# Patient Record
Sex: Male | Born: 1950 | Race: Black or African American | Hispanic: No | Marital: Single | Smoking: Current every day smoker
Health system: Southern US, Community
[De-identification: ages and names within clinical notes are randomized; demographics above are authoritative.]

## PROBLEM LIST (undated history)

## (undated) DIAGNOSIS — K219 Gastro-esophageal reflux disease without esophagitis: Secondary | ICD-10-CM

## (undated) DIAGNOSIS — E230 Hypopituitarism: Secondary | ICD-10-CM

## (undated) DIAGNOSIS — F17200 Nicotine dependence, unspecified, uncomplicated: Secondary | ICD-10-CM

## (undated) DIAGNOSIS — M1990 Unspecified osteoarthritis, unspecified site: Secondary | ICD-10-CM

## (undated) DIAGNOSIS — E785 Hyperlipidemia, unspecified: Secondary | ICD-10-CM

## (undated) DIAGNOSIS — I700 Atherosclerosis of aorta: Secondary | ICD-10-CM

## (undated) DIAGNOSIS — E039 Hypothyroidism, unspecified: Secondary | ICD-10-CM

## (undated) DIAGNOSIS — B182 Chronic viral hepatitis C: Secondary | ICD-10-CM

## (undated) HISTORY — PX: COLONOSCOPY: SHX174

## (undated) HISTORY — DX: Hypopituitarism: E23.0

## (undated) HISTORY — DX: Hyperlipidemia, unspecified: E78.5

## (undated) HISTORY — DX: Nicotine dependence, unspecified, uncomplicated: F17.200

## (undated) HISTORY — DX: Atherosclerosis of aorta: I70.0

---

## 1998-03-29 ENCOUNTER — Emergency Department (HOSPITAL_COMMUNITY): Admission: EM | Admit: 1998-03-29 | Discharge: 1998-03-29 | Payer: Self-pay

## 1998-04-05 ENCOUNTER — Other Ambulatory Visit: Admission: RE | Admit: 1998-04-05 | Discharge: 1998-04-05 | Payer: Self-pay | Admitting: Family Medicine

## 1998-08-25 ENCOUNTER — Emergency Department (HOSPITAL_COMMUNITY): Admission: EM | Admit: 1998-08-25 | Discharge: 1998-08-25 | Payer: Self-pay | Admitting: Emergency Medicine

## 1998-09-01 ENCOUNTER — Ambulatory Visit (HOSPITAL_COMMUNITY): Admission: RE | Admit: 1998-09-01 | Discharge: 1998-09-01 | Payer: Self-pay | Admitting: Family Medicine

## 1998-09-01 ENCOUNTER — Encounter: Payer: Self-pay | Admitting: Family Medicine

## 2000-07-04 ENCOUNTER — Encounter: Payer: Self-pay | Admitting: Emergency Medicine

## 2000-07-04 ENCOUNTER — Emergency Department (HOSPITAL_COMMUNITY): Admission: EM | Admit: 2000-07-04 | Discharge: 2000-07-04 | Payer: Self-pay | Admitting: Emergency Medicine

## 2000-12-20 ENCOUNTER — Emergency Department (HOSPITAL_COMMUNITY): Admission: EM | Admit: 2000-12-20 | Discharge: 2000-12-20 | Payer: Self-pay | Admitting: Emergency Medicine

## 2001-06-03 ENCOUNTER — Ambulatory Visit (HOSPITAL_COMMUNITY): Admission: RE | Admit: 2001-06-03 | Discharge: 2001-06-03 | Payer: Self-pay | Admitting: Family Medicine

## 2001-06-03 ENCOUNTER — Encounter: Payer: Self-pay | Admitting: Family Medicine

## 2001-07-15 ENCOUNTER — Ambulatory Visit (HOSPITAL_COMMUNITY): Admission: RE | Admit: 2001-07-15 | Discharge: 2001-07-15 | Payer: Self-pay | Admitting: Family Medicine

## 2001-07-15 ENCOUNTER — Encounter: Payer: Self-pay | Admitting: Family Medicine

## 2004-12-13 ENCOUNTER — Emergency Department (HOSPITAL_COMMUNITY): Admission: EM | Admit: 2004-12-13 | Discharge: 2004-12-13 | Payer: Self-pay | Admitting: Emergency Medicine

## 2006-02-02 ENCOUNTER — Emergency Department (HOSPITAL_COMMUNITY): Admission: EM | Admit: 2006-02-02 | Discharge: 2006-02-02 | Payer: Self-pay | Admitting: Emergency Medicine

## 2006-06-14 ENCOUNTER — Emergency Department (HOSPITAL_COMMUNITY): Admission: EM | Admit: 2006-06-14 | Discharge: 2006-06-14 | Payer: Self-pay | Admitting: Family Medicine

## 2007-04-02 ENCOUNTER — Emergency Department (HOSPITAL_COMMUNITY): Admission: EM | Admit: 2007-04-02 | Discharge: 2007-04-02 | Payer: Self-pay | Admitting: Emergency Medicine

## 2008-05-28 ENCOUNTER — Emergency Department (HOSPITAL_COMMUNITY): Admission: EM | Admit: 2008-05-28 | Discharge: 2008-05-28 | Payer: Self-pay | Admitting: Emergency Medicine

## 2008-08-04 ENCOUNTER — Ambulatory Visit: Payer: Self-pay | Admitting: Family Medicine

## 2008-09-04 ENCOUNTER — Ambulatory Visit: Payer: Self-pay | Admitting: Family Medicine

## 2008-09-04 LAB — CONVERTED CEMR LAB
ALT: 126 units/L — ABNORMAL HIGH (ref 0–53)
Albumin: 4.3 g/dL (ref 3.5–5.2)
BUN: 12 mg/dL (ref 6–23)
Basophils Absolute: 0 10*3/uL (ref 0.0–0.1)
CO2: 22 meq/L (ref 19–32)
Chlamydia, Swab/Urine, PCR: NEGATIVE
Creatinine, Ser: 0.7 mg/dL (ref 0.40–1.50)
GC Probe Amp, Urine: NEGATIVE
Glucose, Bld: 100 mg/dL — ABNORMAL HIGH (ref 70–99)
HCT: 39.1 % (ref 39.0–52.0)
LDL Cholesterol: 90 mg/dL (ref 0–99)
Lymphs Abs: 2.5 10*3/uL (ref 0.7–4.0)
MCHC: 34.5 g/dL (ref 30.0–36.0)
Monocytes Absolute: 0.5 10*3/uL (ref 0.1–1.0)
PSA: 0.01 ng/mL — ABNORMAL LOW (ref 0.10–4.00)
RBC: 4.19 M/uL — ABNORMAL LOW (ref 4.22–5.81)
RDW: 13.1 % (ref 11.5–15.5)
Sodium: 140 meq/L (ref 135–145)
Total Bilirubin: 0.7 mg/dL (ref 0.3–1.2)
Total CHOL/HDL Ratio: 4.9
WBC: 6.5 10*3/uL (ref 4.0–10.5)

## 2008-10-30 ENCOUNTER — Ambulatory Visit: Payer: Self-pay | Admitting: Family Medicine

## 2008-10-30 LAB — CONVERTED CEMR LAB
Alkaline Phosphatase: 51 units/L (ref 39–117)
Bilirubin, Direct: 0.1 mg/dL (ref 0.0–0.3)
HCV Ab: REACTIVE — AB
Hep A Total Ab: POSITIVE — AB
Hep B E Ab: NEGATIVE
Indirect Bilirubin: 0.6 mg/dL (ref 0.0–0.9)

## 2008-12-01 ENCOUNTER — Ambulatory Visit: Payer: Self-pay | Admitting: Family Medicine

## 2008-12-01 LAB — CONVERTED CEMR LAB
HCV Quantitative: 1250000 intl units/mL — ABNORMAL HIGH (ref <43)
INR: 1.2 (ref 0.0–1.5)
aPTT: 35 s (ref 24–37)

## 2008-12-03 ENCOUNTER — Ambulatory Visit (HOSPITAL_COMMUNITY): Admission: RE | Admit: 2008-12-03 | Discharge: 2008-12-03 | Payer: Self-pay | Admitting: Family Medicine

## 2008-12-29 ENCOUNTER — Ambulatory Visit: Payer: Self-pay | Admitting: Family Medicine

## 2009-01-25 ENCOUNTER — Ambulatory Visit: Payer: Self-pay | Admitting: Family Medicine

## 2009-03-19 ENCOUNTER — Ambulatory Visit: Payer: Self-pay | Admitting: Family Medicine

## 2009-04-20 ENCOUNTER — Ambulatory Visit: Payer: Self-pay | Admitting: Family Medicine

## 2009-05-21 ENCOUNTER — Ambulatory Visit: Payer: Self-pay | Admitting: Family Medicine

## 2009-05-21 LAB — CONVERTED CEMR LAB
Albumin: 4.5 g/dL (ref 3.5–5.2)
Bilirubin, Direct: 0.2 mg/dL (ref 0.0–0.3)
Total Bilirubin: 0.6 mg/dL (ref 0.3–1.2)

## 2009-08-26 ENCOUNTER — Ambulatory Visit: Payer: Self-pay | Admitting: Family Medicine

## 2009-11-10 ENCOUNTER — Emergency Department (HOSPITAL_COMMUNITY): Admission: EM | Admit: 2009-11-10 | Discharge: 2009-11-10 | Payer: Self-pay | Admitting: Emergency Medicine

## 2009-11-11 ENCOUNTER — Ambulatory Visit: Payer: Self-pay | Admitting: Family Medicine

## 2009-11-11 LAB — CONVERTED CEMR LAB
Free T4: 0.74 ng/dL — ABNORMAL LOW (ref 0.80–1.80)
T3 Uptake Ratio: 21.9 % — ABNORMAL LOW (ref 22.5–37.0)
TSH: 1.732 microintl units/mL (ref 0.350–4.500)

## 2009-12-24 ENCOUNTER — Ambulatory Visit: Payer: Self-pay | Admitting: Internal Medicine

## 2009-12-27 ENCOUNTER — Ambulatory Visit (HOSPITAL_COMMUNITY): Admission: RE | Admit: 2009-12-27 | Discharge: 2009-12-27 | Payer: Self-pay | Admitting: Family Medicine

## 2010-01-13 ENCOUNTER — Ambulatory Visit: Payer: Self-pay | Admitting: Gastroenterology

## 2010-01-25 ENCOUNTER — Ambulatory Visit (HOSPITAL_COMMUNITY): Admission: RE | Admit: 2010-01-25 | Discharge: 2010-01-25 | Payer: Self-pay | Admitting: Gastroenterology

## 2010-02-04 ENCOUNTER — Ambulatory Visit: Payer: Self-pay | Admitting: Family Medicine

## 2010-02-28 ENCOUNTER — Encounter: Admission: RE | Admit: 2010-02-28 | Discharge: 2010-05-04 | Payer: Self-pay | Admitting: Family Medicine

## 2010-03-17 ENCOUNTER — Ambulatory Visit: Payer: Self-pay | Admitting: Gastroenterology

## 2010-05-12 ENCOUNTER — Ambulatory Visit: Payer: Self-pay | Admitting: Family Medicine

## 2010-08-31 ENCOUNTER — Emergency Department (HOSPITAL_COMMUNITY)
Admission: EM | Admit: 2010-08-31 | Discharge: 2010-08-31 | Payer: Self-pay | Source: Home / Self Care | Admitting: Emergency Medicine

## 2010-12-06 LAB — CBC
HCT: 34.7 % — ABNORMAL LOW (ref 39.0–52.0)
Hemoglobin: 12.3 g/dL — ABNORMAL LOW (ref 13.0–17.0)
MCHC: 35.4 g/dL (ref 30.0–36.0)
MCV: 95.5 fL (ref 78.0–100.0)
RBC: 3.64 MIL/uL — ABNORMAL LOW (ref 4.22–5.81)
RDW: 13.7 % (ref 11.5–15.5)

## 2011-02-09 ENCOUNTER — Other Ambulatory Visit: Payer: Self-pay | Admitting: Gastroenterology

## 2011-02-09 ENCOUNTER — Ambulatory Visit (HOSPITAL_COMMUNITY)
Admission: RE | Admit: 2011-02-09 | Discharge: 2011-02-09 | Disposition: A | Discharge status: Home / Self Care | Payer: Medicaid Other | Source: Ambulatory Visit | Attending: Gastroenterology | Admitting: Gastroenterology

## 2011-02-09 ENCOUNTER — Ambulatory Visit: Payer: Self-pay | Admitting: Gastroenterology

## 2011-02-09 DIAGNOSIS — R945 Abnormal results of liver function studies: Secondary | ICD-10-CM | POA: Insufficient documentation

## 2011-02-09 DIAGNOSIS — B182 Chronic viral hepatitis C: Secondary | ICD-10-CM

## 2011-02-09 LAB — CBC WITH DIFFERENTIAL/PLATELET
Basophils Relative: 0 % (ref 0–1)
Hemoglobin: 9.4 g/dL — ABNORMAL LOW (ref 13.0–17.0)
Lymphocytes Relative: 34 % (ref 12–46)
MCHC: 34.3 g/dL (ref 30.0–36.0)
MCV: 94.5 fL (ref 78.0–100.0)
Neutrophils Relative %: 56 % (ref 43–77)
RDW: 20.5 % — ABNORMAL HIGH (ref 11.5–15.5)
WBC: 4.1 10*3/uL (ref 4.0–10.5)

## 2011-02-09 LAB — COMPREHENSIVE METABOLIC PANEL
AST: 495 U/L — ABNORMAL HIGH (ref 0–37)
Albumin: 4.3 g/dL (ref 3.5–5.2)
Alkaline Phosphatase: 139 U/L — ABNORMAL HIGH (ref 39–117)
BUN: 12 mg/dL (ref 6–23)
Chloride: 103 mEq/L (ref 96–112)
Creat: 0.69 mg/dL (ref 0.40–1.50)
Glucose, Bld: 92 mg/dL (ref 70–99)
Potassium: 3.3 mEq/L — ABNORMAL LOW (ref 3.5–5.3)
Sodium: 132 mEq/L — ABNORMAL LOW (ref 135–145)
Total Bilirubin: 8.8 mg/dL — ABNORMAL HIGH (ref 0.3–1.2)
Total Protein: 7.4 g/dL (ref 6.0–8.3)

## 2011-02-09 LAB — APTT: aPTT: 38 seconds — ABNORMAL HIGH (ref 24–37)

## 2011-02-09 LAB — LACTATE DEHYDROGENASE: LDH: 266 U/L — ABNORMAL HIGH (ref 94–250)

## 2011-02-09 LAB — IGG: IgG (Immunoglobin G), Serum: 2010 mg/dL — ABNORMAL HIGH (ref 700–1600)

## 2011-03-21 ENCOUNTER — Other Ambulatory Visit: Payer: Self-pay | Admitting: Gastroenterology

## 2011-03-21 DIAGNOSIS — B1920 Unspecified viral hepatitis C without hepatic coma: Secondary | ICD-10-CM

## 2011-03-21 DIAGNOSIS — R17 Unspecified jaundice: Secondary | ICD-10-CM

## 2011-03-28 ENCOUNTER — Other Ambulatory Visit (HOSPITAL_COMMUNITY): Payer: Self-pay

## 2011-03-31 ENCOUNTER — Ambulatory Visit (HOSPITAL_COMMUNITY)
Admission: RE | Admit: 2011-03-31 | Discharge: 2011-03-31 | Disposition: A | Discharge status: Home / Self Care | Payer: Medicaid Other | Source: Ambulatory Visit | Attending: Gastroenterology | Admitting: Gastroenterology

## 2011-03-31 DIAGNOSIS — I714 Abdominal aortic aneurysm, without rupture, unspecified: Secondary | ICD-10-CM | POA: Insufficient documentation

## 2011-03-31 DIAGNOSIS — B1920 Unspecified viral hepatitis C without hepatic coma: Secondary | ICD-10-CM

## 2011-03-31 DIAGNOSIS — B182 Chronic viral hepatitis C: Secondary | ICD-10-CM | POA: Insufficient documentation

## 2011-03-31 DIAGNOSIS — R945 Abnormal results of liver function studies: Secondary | ICD-10-CM | POA: Insufficient documentation

## 2011-03-31 DIAGNOSIS — R17 Unspecified jaundice: Secondary | ICD-10-CM

## 2011-03-31 DIAGNOSIS — R109 Unspecified abdominal pain: Secondary | ICD-10-CM | POA: Insufficient documentation

## 2011-04-19 ENCOUNTER — Other Ambulatory Visit: Payer: Self-pay | Admitting: Gastroenterology

## 2011-04-19 LAB — CBC WITH DIFFERENTIAL/PLATELET
Basophils Absolute: 0 10*3/uL (ref 0.0–0.1)
Hemoglobin: 11.6 g/dL — ABNORMAL LOW (ref 13.0–17.0)
MCV: 99.4 fL (ref 78.0–100.0)
Monocytes Absolute: 0.3 10*3/uL (ref 0.1–1.0)
Neutrophils Relative %: 28 % — ABNORMAL LOW (ref 43–77)
RDW: 13.2 % (ref 11.5–15.5)
WBC: 5.5 10*3/uL (ref 4.0–10.5)

## 2011-06-21 LAB — POCT I-STAT, CHEM 8
BUN: 12
Calcium, Ion: 1.11 — ABNORMAL LOW
Chloride: 111
Glucose, Bld: 106 — ABNORMAL HIGH
HCT: 41
Hemoglobin: 13.9
Potassium: 4.1
TCO2: 25

## 2011-06-21 LAB — POCT CARDIAC MARKERS
CKMB, poc: 2.2
Myoglobin, poc: 60
Troponin i, poc: <0.05

## 2012-04-29 ENCOUNTER — Other Ambulatory Visit: Payer: Self-pay | Admitting: Internal Medicine

## 2012-04-29 ENCOUNTER — Ambulatory Visit (HOSPITAL_COMMUNITY)
Admission: RE | Admit: 2012-04-29 | Discharge: 2012-04-29 | Disposition: A | Discharge status: Home / Self Care | Payer: Medicaid Other | Source: Ambulatory Visit | Attending: Internal Medicine | Admitting: Internal Medicine

## 2012-04-29 DIAGNOSIS — R0602 Shortness of breath: Secondary | ICD-10-CM

## 2012-04-29 DIAGNOSIS — F17200 Nicotine dependence, unspecified, uncomplicated: Secondary | ICD-10-CM | POA: Insufficient documentation

## 2012-05-24 IMAGING — US US ABDOMEN COMPLETE
1 series · 14 of 25 positions shown · non-contrast
Comparison: 02/09/2011

CLINICAL DATA: Abdominal pain.  Elevated liver function test.
Chronic hepatitis C.

ABDOMINAL ULTRASOUND COMPLETE

[Series 1: us abdomen complete · 0.31mm/px · 14 of 79 slices shown]
[im 1/79]
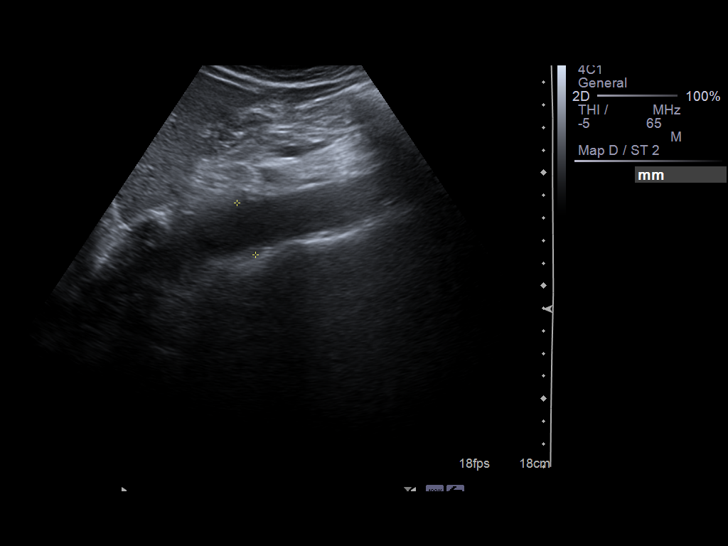
[im 7/79]
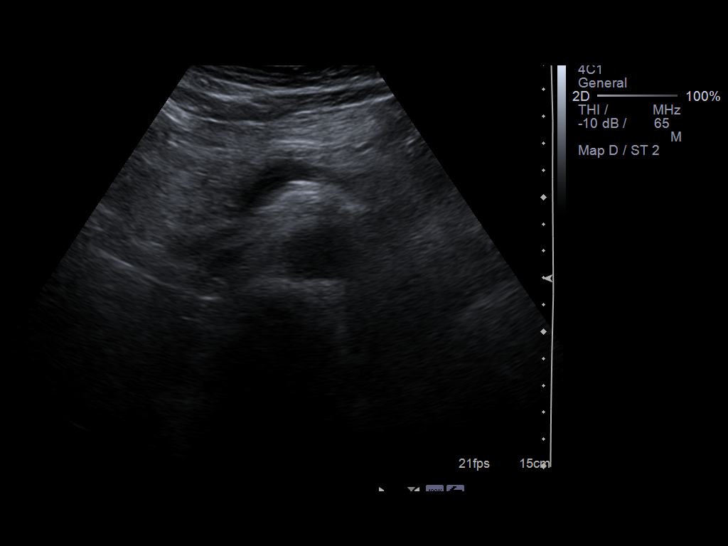
[im 14/79]
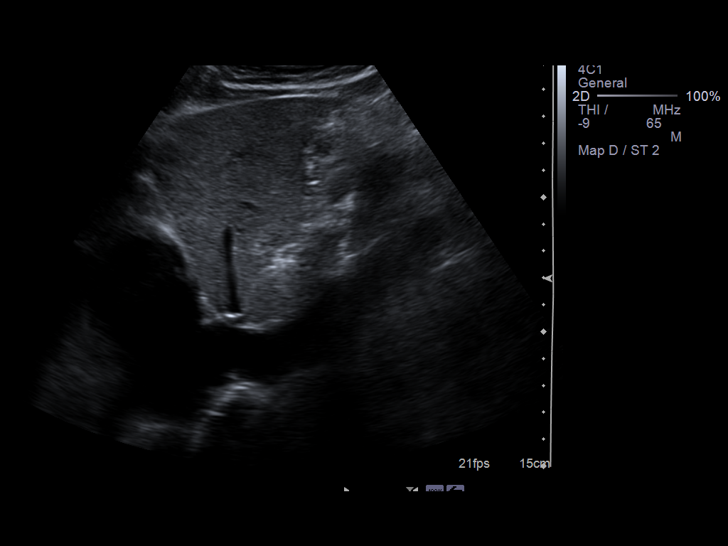
[im 20/79]
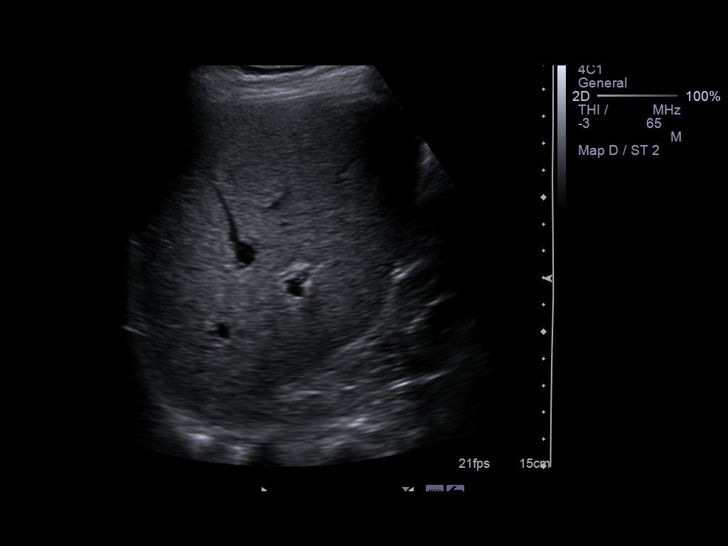
[im 27/79]
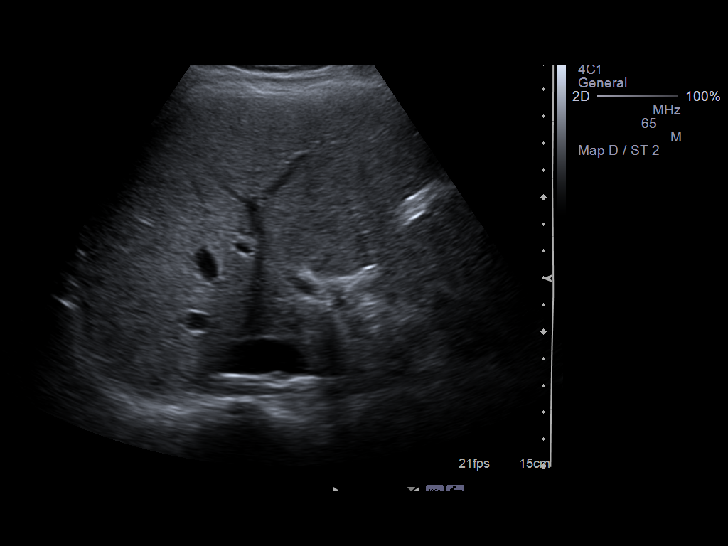
[im 30/79]
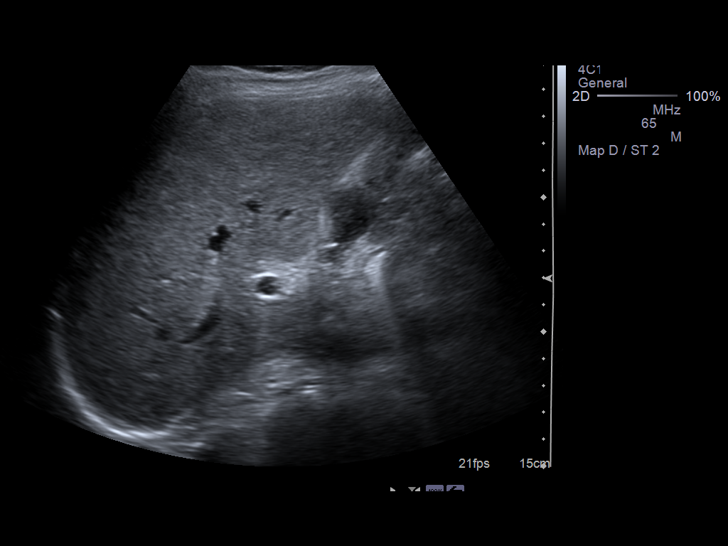
[im 36/79]
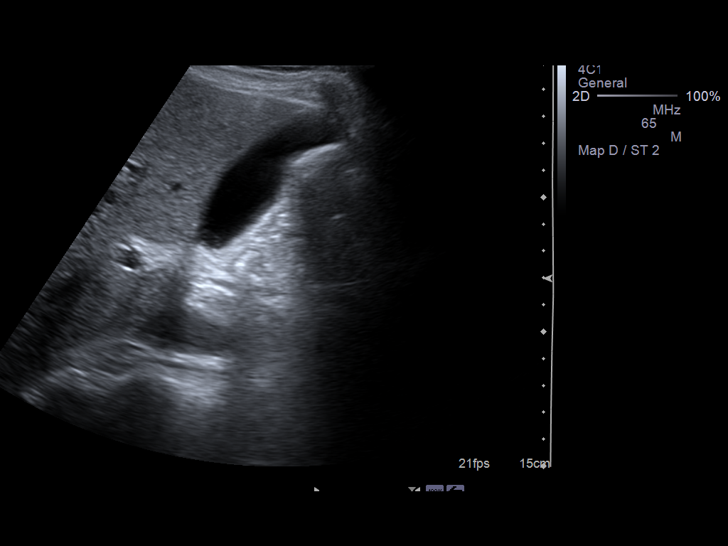
[im 43/79]
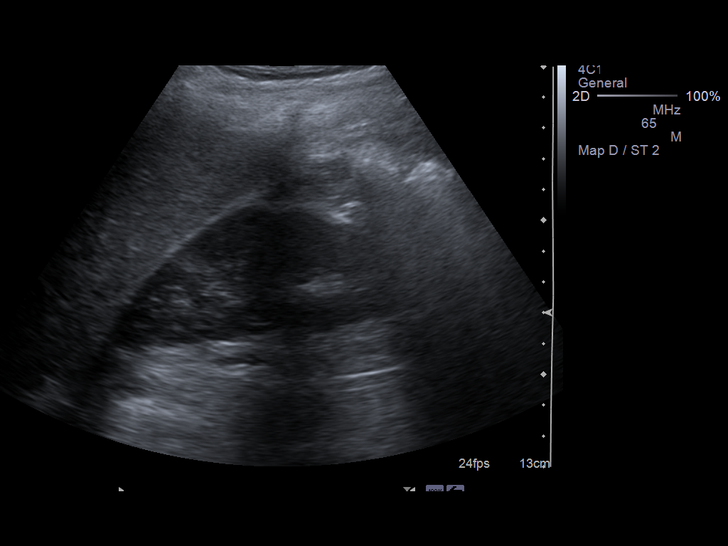
[im 49/79]
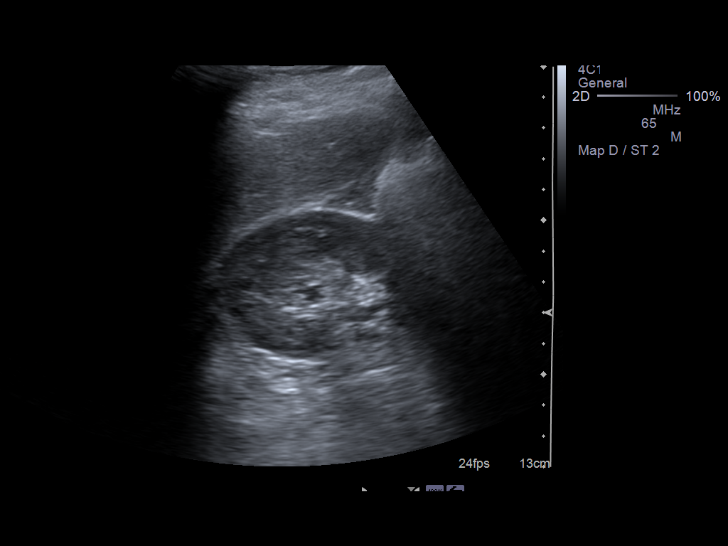
[im 53/79]
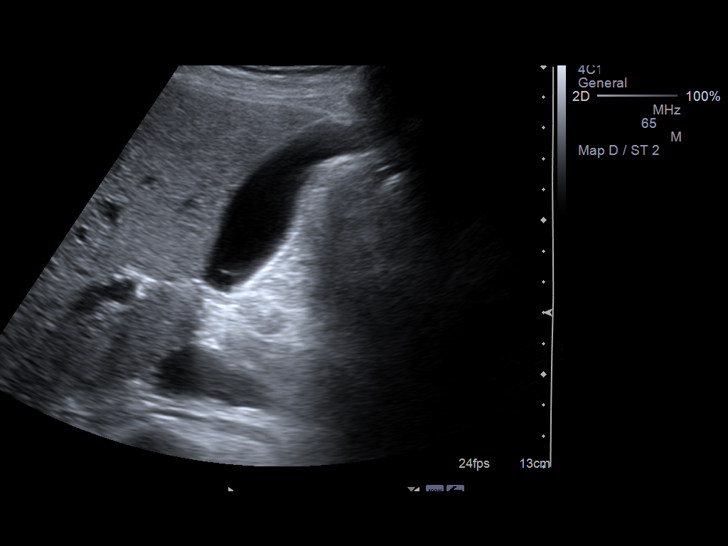
[im 59/79]
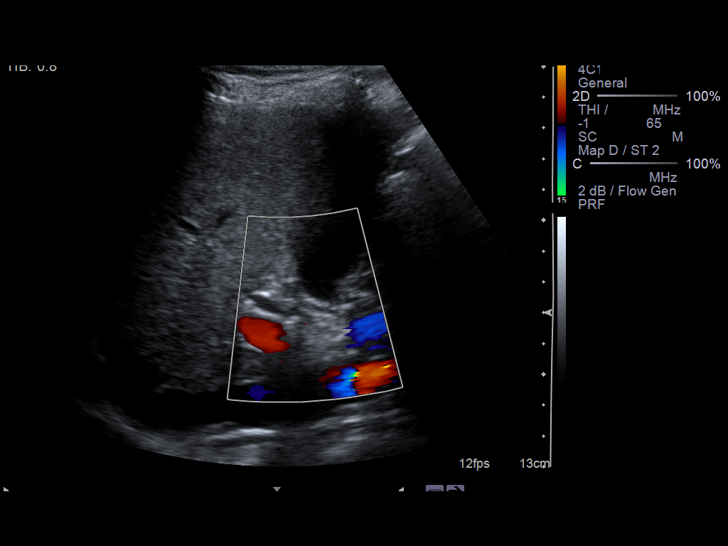
[im 66/79]
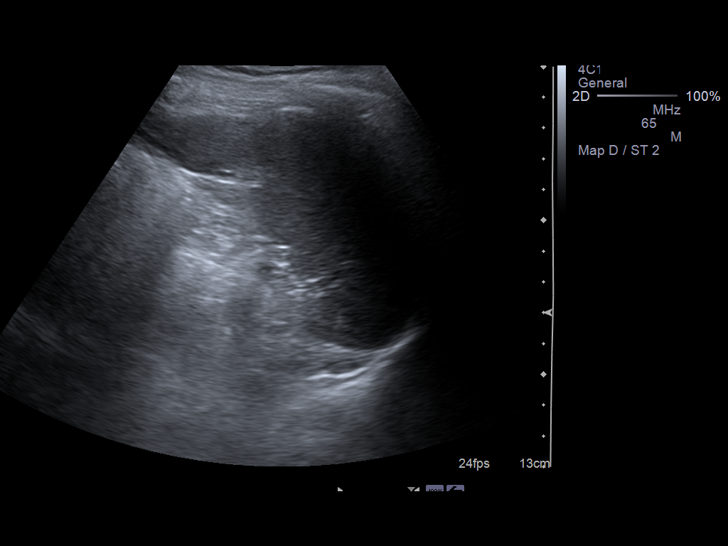
[im 72/79]
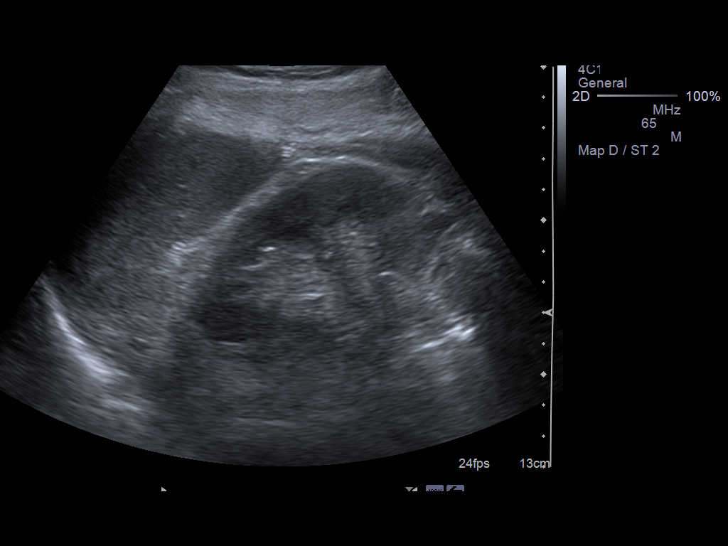
[im 79/79]
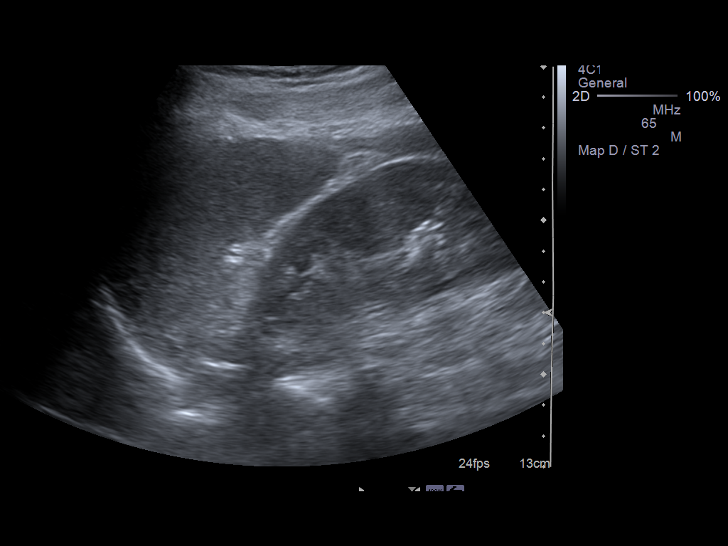

[14 of 25 positions shown; findings below may reference images not displayed]

FINDINGS: Gallbladder:  No gallstones, gallbladder wall thickening, or
pericholecystic fluid.

Common Bile Duct:  Within normal limits in caliber. Measures 5 mm
in diameter.

Liver: No focal mass lesion identified.  Within normal limits in
parenchymal echogenicity.

IVC:  Appears normal.

Pancreas:  No abnormality identified.

Spleen:  Within normal limits in size and echotexture.

Right kidney:  Normal in size and parenchymal echogenicity.  No
evidence of mass or hydronephrosis.

Left kidney:  Normal in size and parenchymal echogenicity.  No
evidence of mass or hydronephrosis.

Abdominal Aorta:  Mild abdominal aortic aneurysm is seen containing
some atherosclerotic plaque.  Aneurysm measures approximately
cm in diameter.
IMPRESSION: 1.  No evidence of gallstones or biliary ductal dilatation.
2.  Unremarkable sonographic appearance of the liver.
3.  Mild abdominal aortic aneurysm measuring approximately 3.4 cm.

## 2013-02-20 ENCOUNTER — Other Ambulatory Visit: Payer: Self-pay | Admitting: Neurosurgery

## 2013-02-20 DIAGNOSIS — D353 Benign neoplasm of craniopharyngeal duct: Secondary | ICD-10-CM

## 2013-02-24 ENCOUNTER — Ambulatory Visit
Admission: RE | Admit: 2013-02-24 | Discharge: 2013-02-24 | Disposition: A | Discharge status: Home / Self Care | Payer: Medicaid Other | Source: Ambulatory Visit | Attending: Neurosurgery | Admitting: Neurosurgery

## 2013-02-24 DIAGNOSIS — D353 Benign neoplasm of craniopharyngeal duct: Secondary | ICD-10-CM

## 2013-02-24 MED ORDER — GADOBENATE DIMEGLUMINE 529 MG/ML IV SOLN
7.0000 mL | Freq: Once | INTRAVENOUS | Status: AC | PRN
  Administered 2013-02-24: 7 mL via INTRAVENOUS

## 2013-02-27 ENCOUNTER — Other Ambulatory Visit: Payer: Self-pay | Admitting: Neurosurgery

## 2013-04-02 ENCOUNTER — Encounter (HOSPITAL_COMMUNITY)
Admission: RE | Admit: 2013-04-02 | Discharge: 2013-04-02 | Disposition: A | Discharge status: Home / Self Care | Payer: Medicaid Other | Source: Ambulatory Visit | Attending: Cardiothoracic Surgery | Admitting: Cardiothoracic Surgery

## 2013-04-02 ENCOUNTER — Encounter (HOSPITAL_COMMUNITY): Payer: Self-pay

## 2013-04-02 DIAGNOSIS — Z01818 Encounter for other preprocedural examination: Secondary | ICD-10-CM | POA: Insufficient documentation

## 2013-04-02 DIAGNOSIS — Z01812 Encounter for preprocedural laboratory examination: Secondary | ICD-10-CM | POA: Insufficient documentation

## 2013-04-02 HISTORY — DX: Unspecified osteoarthritis, unspecified site: M19.90

## 2013-04-02 HISTORY — DX: Chronic viral hepatitis C: B18.2

## 2013-04-02 LAB — CBC
HCT: 37.8 % — ABNORMAL LOW (ref 39.0–52.0)
Hemoglobin: 14 g/dL (ref 13.0–17.0)
MCH: 31.3 pg (ref 26.0–34.0)
MCHC: 37 g/dL — ABNORMAL HIGH (ref 30.0–36.0)
MCV: 84.6 fL (ref 78.0–100.0)
Platelets: 248 10*3/uL (ref 150–400)
RBC: 4.47 MIL/uL (ref 4.22–5.81)
RDW: 13.6 % (ref 11.5–15.5)
WBC: 9 10*3/uL (ref 4.0–10.5)

## 2013-04-02 LAB — COMPREHENSIVE METABOLIC PANEL
ALT: 11 U/L (ref 0–53)
AST: 24 U/L (ref 0–37)
Albumin: 3.9 g/dL (ref 3.5–5.2)
Alkaline Phosphatase: 43 U/L (ref 39–117)
BUN: 11 mg/dL (ref 6–23)
CO2: 26 mEq/L (ref 19–32)
Calcium: 9.4 mg/dL (ref 8.4–10.5)
Chloride: 104 mEq/L (ref 96–112)
Creatinine, Ser: 0.78 mg/dL (ref 0.50–1.35)
GFR calc Af Amer: >90 mL/min (ref >90)
GFR calc non Af Amer: >90 mL/min (ref >90)
Glucose, Bld: 89 mg/dL (ref 70–99)
Potassium: 3.8 mEq/L (ref 3.5–5.1)
Sodium: 138 mEq/L (ref 135–145)
Total Bilirubin: 0.3 mg/dL (ref 0.3–1.2)
Total Protein: 7.2 g/dL (ref 6.0–8.3)

## 2013-04-02 NOTE — Pre-Procedure Instructions (Signed)
EHSAN CORVIN  04/02/2013   Your procedure is scheduled on:  04/10/13  Report to [facility] at 530 AM.  Call this number if you have problems the morning of surgery: [phone]   Remember:   Do not eat food or drink liquids after midnight.   Take these medicines the morning of surgery with A SIP OF WATER: flexeril   Do not wear jewelry, make-up or nail polish.  Do not wear lotions, powders, or perfumes. You may wear deodorant.  Do not shave 48 hours prior to surgery. Men may shave face and neck.  Do not bring valuables to the hospital.  [facility] is not responsible                   for any belongings or valuables.  Contacts, dentures or bridgework may not be worn into surgery.  Leave suitcase in the car. After surgery it may be brought to your room.  For patients admitted to the hospital, checkout time is 11:00 AM the day of  discharge.   Patients discharged the day of surgery will not be allowed to drive  home.  Name and phone number of your driver: family  Special Instructions: Shower using CHG 2 nights before surgery and the night before surgery.  If you shower the day of surgery use CHG.  Use special wash - you have one bottle of CHG for all showers.  You should use approximately 1/3 of the bottle for each shower.   Please read over the following fact sheets that you were given: Pain Booklet, Coughing and Deep Breathing and Surgical Site Infection Prevention

## 2013-04-09 MED ORDER — CEFAZOLIN SODIUM-DEXTROSE 2-3 GM-% IV SOLR
2.0000 g | INTRAVENOUS | Status: AC
  Administered 2013-04-10: 2 g via INTRAVENOUS
  Filled 2013-04-09: qty 50

## 2013-04-10 ENCOUNTER — Inpatient Hospital Stay (HOSPITAL_COMMUNITY): Payer: Medicaid Other

## 2013-04-10 ENCOUNTER — Encounter (HOSPITAL_COMMUNITY): Payer: Self-pay | Admitting: *Deleted

## 2013-04-10 ENCOUNTER — Ambulatory Visit (HOSPITAL_COMMUNITY): Payer: Medicaid Other | Admitting: Anesthesiology

## 2013-04-10 ENCOUNTER — Inpatient Hospital Stay (HOSPITAL_COMMUNITY)
Admission: RE | Admit: 2013-04-10 | Discharge: 2013-04-13 | DRG: 614 | Disposition: A | Discharge status: Home / Self Care | Payer: Medicaid Other | Source: Ambulatory Visit | Attending: Neurosurgery | Admitting: Neurosurgery

## 2013-04-10 ENCOUNTER — Encounter (HOSPITAL_COMMUNITY)
Admission: RE | Disposition: A | Discharge status: Home / Self Care | Payer: Self-pay | Source: Ambulatory Visit | Attending: Neurosurgery

## 2013-04-10 ENCOUNTER — Encounter (HOSPITAL_COMMUNITY): Payer: Self-pay | Admitting: Anesthesiology

## 2013-04-10 DIAGNOSIS — Z833 Family history of diabetes mellitus: Secondary | ICD-10-CM

## 2013-04-10 DIAGNOSIS — R7309 Other abnormal glucose: Secondary | ICD-10-CM | POA: Diagnosis present

## 2013-04-10 DIAGNOSIS — F17200 Nicotine dependence, unspecified, uncomplicated: Secondary | ICD-10-CM | POA: Diagnosis present

## 2013-04-10 DIAGNOSIS — E2749 Other adrenocortical insufficiency: Secondary | ICD-10-CM | POA: Diagnosis present

## 2013-04-10 DIAGNOSIS — H5347 Heteronymous bilateral field defects: Secondary | ICD-10-CM | POA: Diagnosis present

## 2013-04-10 DIAGNOSIS — D352 Benign neoplasm of pituitary gland: Principal | ICD-10-CM | POA: Diagnosis present

## 2013-04-10 DIAGNOSIS — E871 Hypo-osmolality and hyponatremia: Secondary | ICD-10-CM | POA: Diagnosis present

## 2013-04-10 DIAGNOSIS — I498 Other specified cardiac arrhythmias: Secondary | ICD-10-CM | POA: Diagnosis present

## 2013-04-10 DIAGNOSIS — E230 Hypopituitarism: Secondary | ICD-10-CM | POA: Diagnosis present

## 2013-04-10 DIAGNOSIS — E038 Other specified hypothyroidism: Secondary | ICD-10-CM | POA: Diagnosis present

## 2013-04-10 DIAGNOSIS — E291 Testicular hypofunction: Secondary | ICD-10-CM | POA: Diagnosis present

## 2013-04-10 DIAGNOSIS — D497 Neoplasm of unspecified behavior of endocrine glands and other parts of nervous system: Secondary | ICD-10-CM

## 2013-04-10 HISTORY — DX: Hypopituitarism: E23.0

## 2013-04-10 HISTORY — PX: TRANSNASAL APPROACH: SHX6149

## 2013-04-10 HISTORY — PX: CRANIOTOMY: SHX93

## 2013-04-10 LAB — BASIC METABOLIC PANEL
BUN: 8 mg/dL (ref 6–23)
CO2: 24 mEq/L (ref 19–32)
Calcium: 8.3 mg/dL — ABNORMAL LOW (ref 8.4–10.5)
Calcium: 8.4 mg/dL (ref 8.4–10.5)
Chloride: 108 mEq/L (ref 96–112)
Creatinine, Ser: 0.65 mg/dL (ref 0.50–1.35)
GFR calc Af Amer: >90 mL/min (ref >90)
GFR calc Af Amer: >90 mL/min (ref >90)
GFR calc non Af Amer: >90 mL/min (ref >90)
GFR calc non Af Amer: >90 mL/min (ref >90)
Glucose, Bld: 148 mg/dL — ABNORMAL HIGH (ref 70–99)
Glucose, Bld: 150 mg/dL — ABNORMAL HIGH (ref 70–99)
Potassium: 3.7 mEq/L (ref 3.5–5.1)
Potassium: 3.7 mEq/L (ref 3.5–5.1)
Sodium: 139 mEq/L (ref 135–145)
Sodium: 133 mEq/L — ABNORMAL LOW (ref 135–145)

## 2013-04-10 SURGERY — CRANIOTOMY HYPOPHYSECTOMY TRANSNASAL APPROACH
Anesthesia: General | Site: Nose | Wound class: Clean Contaminated

## 2013-04-10 MED ORDER — ONDANSETRON HCL 4 MG PO TABS: 4.0000 mg | ORAL_TABLET | ORAL | Status: DC | PRN

## 2013-04-10 MED ORDER — MORPHINE SULFATE 2 MG/ML IJ SOLN
1.0000 mg | INTRAMUSCULAR | Status: DC | PRN
  Administered 2013-04-10 (×2): 2 mg via INTRAVENOUS
  Administered 2013-04-10: 1 mg via INTRAVENOUS
  Administered 2013-04-11: 2 mg via INTRAVENOUS
  Administered 2013-04-11: 1 mg via INTRAVENOUS
  Filled 2013-04-10 (×6): qty 1

## 2013-04-10 MED ORDER — OXYCODONE HCL 5 MG PO TABS: 5.0000 mg | ORAL_TABLET | Freq: Once | ORAL | Status: DC | PRN

## 2013-04-10 MED ORDER — KCL IN DEXTROSE-NACL 20-5-0.45 MEQ/L-%-% IV SOLN
INTRAVENOUS | Status: DC | PRN
  Administered 2013-04-10: 09:00:00 via INTRAVENOUS

## 2013-04-10 MED ORDER — POVIDONE-IODINE 10 % EX SOLN
CUTANEOUS | Status: DC | PRN
  Administered 2013-04-10: 1 via TOPICAL

## 2013-04-10 MED ORDER — BISACODYL 10 MG RE SUPP: 10.0000 mg | Freq: Every day | RECTAL | Status: DC | PRN

## 2013-04-10 MED ORDER — LIDOCAINE-EPINEPHRINE 1 %-1:100000 IJ SOLN
INTRAMUSCULAR | Status: DC | PRN
  Administered 2013-04-10: 5 mL
  Administered 2013-04-10: 6 mL

## 2013-04-10 MED ORDER — CEFAZOLIN SODIUM-DEXTROSE 2-3 GM-% IV SOLR
2.0000 g | Freq: Three times a day (TID) | INTRAVENOUS | Status: DC
  Administered 2013-04-10 – 2013-04-11 (×3): 2 g via INTRAVENOUS
  Filled 2013-04-10 (×5): qty 50

## 2013-04-10 MED ORDER — MIDAZOLAM HCL 5 MG/5ML IJ SOLN
INTRAMUSCULAR | Status: DC | PRN
  Administered 2013-04-10: 2 mg via INTRAVENOUS

## 2013-04-10 MED ORDER — POTASSIUM CHLORIDE 2 MEQ/ML IV SOLN
INTRAVENOUS | Status: DC
  Filled 2013-04-10: qty 1000

## 2013-04-10 MED ORDER — EPHEDRINE SULFATE 50 MG/ML IJ SOLN
INTRAMUSCULAR | Status: DC | PRN
  Administered 2013-04-10: 10 mg via INTRAVENOUS

## 2013-04-10 MED ORDER — SODIUM CHLORIDE 0.9 % IR SOLN
Status: DC | PRN
  Administered 2013-04-10: 1000 mL

## 2013-04-10 MED ORDER — PHENYLEPHRINE HCL 10 MG/ML IJ SOLN
INTRAMUSCULAR | Status: DC | PRN
  Administered 2013-04-10: 80 ug via INTRAVENOUS

## 2013-04-10 MED ORDER — GLYCOPYRROLATE 0.2 MG/ML IJ SOLN
INTRAMUSCULAR | Status: DC | PRN
  Administered 2013-04-10: 0.6 mg via INTRAVENOUS

## 2013-04-10 MED ORDER — MAGNESIUM HYDROXIDE 400 MG/5ML PO SUSP: 30.0000 mL | Freq: Every day | ORAL | Status: DC | PRN

## 2013-04-10 MED ORDER — ONDANSETRON HCL 4 MG/2ML IJ SOLN: 4.0000 mg | INTRAMUSCULAR | Status: DC | PRN

## 2013-04-10 MED ORDER — OXYMETAZOLINE HCL 0.05 % NA SOLN
NASAL | Status: DC | PRN
  Administered 2013-04-10: 1 via NASAL

## 2013-04-10 MED ORDER — KCL IN DEXTROSE-NACL 20-5-0.45 MEQ/L-%-% IV SOLN: INTRAVENOUS | Status: DC

## 2013-04-10 MED ORDER — ROCURONIUM BROMIDE 100 MG/10ML IV SOLN
INTRAVENOUS | Status: DC | PRN
  Administered 2013-04-10: 50 mg via INTRAVENOUS
  Administered 2013-04-10: 20 mg via INTRAVENOUS

## 2013-04-10 MED ORDER — BACITRACIN ZINC 500 UNIT/GM EX OINT
TOPICAL_OINTMENT | CUTANEOUS | Status: DC | PRN
  Administered 2013-04-10: 1 via TOPICAL

## 2013-04-10 MED ORDER — OXYCODONE HCL 5 MG/5ML PO SOLN
5.0000 mg | Freq: Once | ORAL | Status: DC | PRN
Start: 2013-04-10 — End: 2013-04-10

## 2013-04-10 MED ORDER — PANTOPRAZOLE SODIUM 40 MG IV SOLR
40.0000 mg | Freq: Every day | INTRAVENOUS | Status: DC
  Administered 2013-04-10 – 2013-04-11 (×2): 40 mg via INTRAVENOUS
  Filled 2013-04-10 (×3): qty 40

## 2013-04-10 MED ORDER — NEOSTIGMINE METHYLSULFATE 1 MG/ML IJ SOLN
INTRAMUSCULAR | Status: DC | PRN
  Administered 2013-04-10: 5 mg via INTRAVENOUS

## 2013-04-10 MED ORDER — THROMBIN 20000 UNITS EX SOLR
CUTANEOUS | Status: DC | PRN
  Administered 2013-04-10: 10:00:00 via TOPICAL

## 2013-04-10 MED ORDER — ARTIFICIAL TEARS OP OINT
TOPICAL_OINTMENT | OPHTHALMIC | Status: DC | PRN
  Administered 2013-04-10: 1 via OPHTHALMIC

## 2013-04-10 MED ORDER — LABETALOL HCL 5 MG/ML IV SOLN: 5.0000 mg | INTRAVENOUS | Status: DC | PRN

## 2013-04-10 MED ORDER — HYDROXYZINE HCL 50 MG PO TABS
50.0000 mg | ORAL_TABLET | ORAL | Status: DC | PRN
  Filled 2013-04-10: qty 1

## 2013-04-10 MED ORDER — ONDANSETRON HCL 4 MG/2ML IJ SOLN
INTRAMUSCULAR | Status: DC | PRN
  Administered 2013-04-10: 4 mg via INTRAVENOUS

## 2013-04-10 MED ORDER — PROMETHAZINE HCL 25 MG/ML IJ SOLN: 6.2500 mg | INTRAMUSCULAR | Status: DC | PRN

## 2013-04-10 MED ORDER — SODIUM CHLORIDE 0.9 % IV SOLN
INTRAVENOUS | Status: AC
  Administered 2013-04-10 (×2): via INTRAVENOUS
  Filled 2013-04-10: qty 500

## 2013-04-10 MED ORDER — FENTANYL CITRATE 0.05 MG/ML IJ SOLN
INTRAMUSCULAR | Status: DC | PRN
  Administered 2013-04-10 (×5): 50 ug via INTRAVENOUS
  Administered 2013-04-10: 100 ug via INTRAVENOUS

## 2013-04-10 MED ORDER — SIMVASTATIN 10 MG PO TABS
10.0000 mg | ORAL_TABLET | Freq: Every day | ORAL | Status: DC
  Administered 2013-04-11 – 2013-04-12 (×2): 10 mg via ORAL
  Filled 2013-04-10 (×4): qty 1

## 2013-04-10 MED ORDER — POTASSIUM CHLORIDE 2 MEQ/ML IV SOLN
INTRAVENOUS | Status: DC
  Administered 2013-04-10 – 2013-04-11 (×4): via INTRAVENOUS
  Filled 2013-04-10 (×7): qty 1000

## 2013-04-10 MED ORDER — HYDROMORPHONE HCL PF 1 MG/ML IJ SOLN
INTRAMUSCULAR | Status: AC
  Filled 2013-04-10: qty 1

## 2013-04-10 MED ORDER — LIDOCAINE HCL (CARDIAC) 20 MG/ML IV SOLN
INTRAVENOUS | Status: DC | PRN
  Administered 2013-04-10: 55 mg via INTRAVENOUS

## 2013-04-10 MED ORDER — HYDROMORPHONE HCL PF 1 MG/ML IJ SOLN
0.2500 mg | INTRAMUSCULAR | Status: DC | PRN
  Administered 2013-04-10: 0.5 mg via INTRAVENOUS

## 2013-04-10 MED ORDER — PROPOFOL 10 MG/ML IV BOLUS
INTRAVENOUS | Status: DC | PRN
  Administered 2013-04-10: 50 mg via INTRAVENOUS
  Administered 2013-04-10: 10 mg via INTRAVENOUS
  Administered 2013-04-10: 30 mg via INTRAVENOUS
  Administered 2013-04-10: 160 mg via INTRAVENOUS

## 2013-04-10 MED ORDER — BUPIVACAINE HCL (PF) 0.5 % IJ SOLN
INTRAMUSCULAR | Status: DC | PRN
  Administered 2013-04-10: 5 mL

## 2013-04-10 MED ORDER — BACITRACIN 50000 UNITS IM SOLR
INTRAMUSCULAR | Status: AC
  Filled 2013-04-10: qty 1

## 2013-04-10 MED ORDER — SODIUM CHLORIDE 0.9 % IR SOLN
Status: DC | PRN
  Administered 2013-04-10: 10:00:00

## 2013-04-10 MED ORDER — HYDRALAZINE HCL 20 MG/ML IJ SOLN: 5.0000 mg | INTRAMUSCULAR | Status: DC | PRN

## 2013-04-10 MED ORDER — SODIUM CHLORIDE 0.9 % IV SOLN
10.0000 mg | INTRAVENOUS | Status: DC | PRN
  Administered 2013-04-10: 10 ug/min via INTRAVENOUS

## 2013-04-10 SURGICAL SUPPLY — 127 items
23G3/4X12INCH VACUTAINER ×2 IMPLANT
ATTRACTOMAT 16X20 MAGNETIC DRP (DRAPES) IMPLANT
BAG DECANTER FOR FLEXI CONT (MISCELLANEOUS) ×2 IMPLANT
BENZOIN TINCTURE PRP APPL 2/3 (GAUZE/BANDAGES/DRESSINGS) IMPLANT
BLADE EYE SICKLE 84 5 BEAV (BLADE) IMPLANT
BLADE ROTATE RAD 40 4 M4 (BLADE) IMPLANT
BLADE ROTATE TRICUT 4X13 M4 (BLADE) IMPLANT
BLADE SURG 10 STRL SS (BLADE) ×2 IMPLANT
BLADE SURG 11 STRL SS (BLADE) ×4 IMPLANT
BLADE SURG 15 STRL LF DISP TIS (BLADE) ×1 IMPLANT
BLADE SURG 15 STRL SS (BLADE) ×1
BUR NEURO DIAMOND 3X3.8 (BURR) IMPLANT
BURR NEURO DIAMOND 3X3.8 (BURR)
CANISTER SUCTION 2500CC (MISCELLANEOUS) ×2 IMPLANT
CATH ROBINSON RED A/P 10FR (CATHETERS) IMPLANT
CATH ROBINSON RED A/P 14FR (CATHETERS) IMPLANT
CLOTH BEACON ORANGE TIMEOUT ST (SAFETY) ×2 IMPLANT
COAGULATOR SUCT SWTCH 10FR 6 (ELECTROSURGICAL) IMPLANT
CONT SPEC 4OZ CLIKSEAL STRL BL (MISCELLANEOUS) ×6 IMPLANT
CORDS BIPOLAR (ELECTRODE) ×2 IMPLANT
COTTONBALL LRG STERILE PKG (GAUZE/BANDAGES/DRESSINGS) IMPLANT
COVER MAYO STAND STRL (DRAPES) ×4 IMPLANT
DECANTER SPIKE VIAL GLASS SM (MISCELLANEOUS) ×2 IMPLANT
DEPRESSOR TONGUE BLADE STERILE (MISCELLANEOUS) ×6 IMPLANT
DERMABOND ADHESIVE PROPEN (GAUZE/BANDAGES/DRESSINGS) ×1
DERMABOND ADVANCED .7 DNX6 (GAUZE/BANDAGES/DRESSINGS) ×1 IMPLANT
DRAIN SUBARACHNOID (WOUND CARE) IMPLANT
DRAPE C-ARM 42X72 X-RAY (DRAPES) IMPLANT
DRAPE EENT ADH APERT 15X15 STR (DRAPES) IMPLANT
DRAPE INCISE IOBAN 66X45 STRL (DRAPES) ×2 IMPLANT
DRAPE MICROSCOPE LEICA (MISCELLANEOUS) ×2 IMPLANT
DRAPE POUCH INSTRU U-SHP 10X18 (DRAPES) ×2 IMPLANT
DRAPE PROXIMA HALF (DRAPES) IMPLANT
DRESSING NASAL KENNEDY 3.5X.9 (MISCELLANEOUS) IMPLANT
DRESSING TELFA 8X3 (GAUZE/BANDAGES/DRESSINGS) ×2 IMPLANT
DRSG NASAL KENNEDY 3.5X.9 (MISCELLANEOUS)
DURAPREP 26ML APPLICATOR (WOUND CARE) ×2 IMPLANT
ELECT CAUTERY BLADE 6.4 (BLADE) ×2 IMPLANT
ELECT COATED BLADE 2.86 ST (ELECTRODE) ×2 IMPLANT
ELECT NEEDLE TIP 2.8 STRL (NEEDLE) ×2 IMPLANT
ELECT REM PT RETURN 9FT ADLT (ELECTROSURGICAL) ×2
ELECTRODE REM PT RTRN 9FT ADLT (ELECTROSURGICAL) ×1 IMPLANT
FILTER ARTHROSCOPY CONVERTOR (FILTER) IMPLANT
GAUZE PACKING FOLDED 1IN STRL (GAUZE/BANDAGES/DRESSINGS) IMPLANT
GAUZE PACKING FOLDED 2  STR (GAUZE/BANDAGES/DRESSINGS)
GAUZE PACKING FOLDED 2 STR (GAUZE/BANDAGES/DRESSINGS) IMPLANT
GAUZE SPONGE 2X2 8PLY STRL LF (GAUZE/BANDAGES/DRESSINGS) ×1 IMPLANT
GLOVE BIO SURGEON STRL SZ7.5 (GLOVE) IMPLANT
GLOVE BIOGEL PI IND STRL 7.0 (GLOVE) ×1 IMPLANT
GLOVE BIOGEL PI IND STRL 8 (GLOVE) ×2 IMPLANT
GLOVE BIOGEL PI IND STRL 8.5 (GLOVE) ×2 IMPLANT
GLOVE BIOGEL PI INDICATOR 7.0 (GLOVE) ×1
GLOVE BIOGEL PI INDICATOR 8 (GLOVE) ×2
GLOVE BIOGEL PI INDICATOR 8.5 (GLOVE) ×2
GLOVE ECLIPSE 7.5 STRL STRAW (GLOVE) ×4 IMPLANT
GLOVE EXAM NITRILE LRG STRL (GLOVE) IMPLANT
GLOVE EXAM NITRILE MD LF STRL (GLOVE) IMPLANT
GLOVE EXAM NITRILE XL STR (GLOVE) IMPLANT
GLOVE EXAM NITRILE XS STR PU (GLOVE) IMPLANT
GLOVE SURG SS PI 8.0 STRL IVOR (GLOVE) ×6 IMPLANT
GOWN BRE IMP SLV AUR LG STRL (GOWN DISPOSABLE) IMPLANT
GOWN BRE IMP SLV AUR XL STRL (GOWN DISPOSABLE) ×4 IMPLANT
GOWN STRL NON-REIN LRG LVL3 (GOWN DISPOSABLE) IMPLANT
GOWN STRL REIN 2XL LVL4 (GOWN DISPOSABLE) ×4 IMPLANT
HEMOSTAT SURGICEL 2X14 (HEMOSTASIS) IMPLANT
KIT BASIN OR (CUSTOM PROCEDURE TRAY) ×2 IMPLANT
KIT ROOM TURNOVER OR (KITS) ×2 IMPLANT
MARKER SKIN DUAL TIP RULER LAB (MISCELLANEOUS) ×2 IMPLANT
NEEDLE 18GX1X1/2 (RX/OR ONLY) (NEEDLE) IMPLANT
NEEDLE HYPO 25X1 1.5 SAFETY (NEEDLE) IMPLANT
NEEDLE SPNL 22GX3.5 QUINCKE BK (NEEDLE) IMPLANT
NS IRRIG 1000ML POUR BTL (IV SOLUTION) ×2 IMPLANT
PAD ARMBOARD 7.5X6 YLW CONV (MISCELLANEOUS) ×6 IMPLANT
PAD ENT ADHESIVE 25PK (MISCELLANEOUS) ×2 IMPLANT
PATTIES SURGICAL .25X.25 (GAUZE/BANDAGES/DRESSINGS) IMPLANT
PATTIES SURGICAL .5 X.5 (GAUZE/BANDAGES/DRESSINGS) ×2 IMPLANT
PATTIES SURGICAL .5 X3 (DISPOSABLE) ×2 IMPLANT
PENCIL BUTTON HOLSTER BLD 10FT (ELECTRODE) IMPLANT
RUBBERBAND STERILE (MISCELLANEOUS) ×4 IMPLANT
SHEET SIL 040 (INSTRUMENTS) IMPLANT
SPECIMEN JAR SMALL (MISCELLANEOUS) IMPLANT
SPONGE GAUZE 2X2 STER 10/PKG (GAUZE/BANDAGES/DRESSINGS) ×1
SPONGE GAUZE 4X4 12PLY (GAUZE/BANDAGES/DRESSINGS) IMPLANT
SPONGE LAP 4X18 X RAY DECT (DISPOSABLE) IMPLANT
SPONGE NEURO XRAY DETECT 1X3 (DISPOSABLE) IMPLANT
SPONGE SURGIFOAM ABS GEL 100 (HEMOSTASIS) ×2 IMPLANT
SPONGE SURGIFOAM ABS GEL 12-7 (HEMOSTASIS) IMPLANT
STAPLER SKIN PROX WIDE 3.9 (STAPLE) ×2 IMPLANT
STRIP CLOSURE SKIN 1/2X4 (GAUZE/BANDAGES/DRESSINGS) IMPLANT
STRIP CLOSURE SKIN 1/4X4 (GAUZE/BANDAGES/DRESSINGS) IMPLANT
SUT BONE WAX W31G (SUTURE) IMPLANT
SUT CHROMIC 3 0 PS 2 (SUTURE) IMPLANT
SUT CHROMIC 4 0 P 3 18 (SUTURE) ×2 IMPLANT
SUT ETHILON 3 0 FSL (SUTURE) ×2 IMPLANT
SUT ETHILON 3 0 PS 1 (SUTURE) IMPLANT
SUT ETHILON 4 0 PS 2 18 (SUTURE) ×2 IMPLANT
SUT ETHILON 5 0 P 3 18 (SUTURE) ×1
SUT ETHILON 6 0 P 1 (SUTURE) IMPLANT
SUT NOVAFIL 6 0 PRE 2 4412 13 (SUTURE) IMPLANT
SUT NYLON ETHILON 5-0 P-3 1X18 (SUTURE) ×1 IMPLANT
SUT PLAIN 4 0 ~~LOC~~ 1 (SUTURE) ×2 IMPLANT
SUT PROLENE 6 0 BV (SUTURE) IMPLANT
SUT VIC AB 2-0 CP2 18 (SUTURE) ×2 IMPLANT
SUT VIC AB 2-0 CT1 27 (SUTURE)
SUT VIC AB 2-0 CT1 27XBRD (SUTURE) IMPLANT
SUT VIC AB 3-0 SH 8-18 (SUTURE) ×2 IMPLANT
SUT VIC AB 4-0 P-3 18X BRD (SUTURE) IMPLANT
SUT VIC AB 4-0 P3 18 (SUTURE)
SWAB COLLECTION DEVICE MRSA (MISCELLANEOUS) IMPLANT
SYR 5ML LL (SYRINGE) IMPLANT
SYR BULB 3OZ (MISCELLANEOUS) ×2 IMPLANT
SYR CONTROL 10ML LL (SYRINGE) ×2 IMPLANT
SYR TB 1ML 25GX5/8 (SYRINGE) ×2 IMPLANT
TOWEL OR 17X24 6PK STRL BLUE (TOWEL DISPOSABLE) ×2 IMPLANT
TOWEL OR 17X26 10 PK STRL BLUE (TOWEL DISPOSABLE) ×2 IMPLANT
TRACKER ENT INSTRUMENT (MISCELLANEOUS) ×2 IMPLANT
TRACKER ENT PATIENT (MISCELLANEOUS) ×2 IMPLANT
TRAP SPECIMEN MUCOUS 40CC (MISCELLANEOUS) IMPLANT
TRAY ENT MC OR (CUSTOM PROCEDURE TRAY) ×2 IMPLANT
TRAY FOLEY CATH 16FRSI W/METER (SET/KITS/TRAYS/PACK) ×2 IMPLANT
TUBE ANAEROBIC SPECIMEN COL (MISCELLANEOUS) IMPLANT
TUBE CONNECTING 12X1/4 (SUCTIONS) ×2 IMPLANT
TUBING EXTENTION W/L.L. (IV SETS) IMPLANT
TUBING STRAIGHTSHOT EPS 5PK (TUBING) IMPLANT
UNDERPAD 30X30 INCONTINENT (UNDERPADS AND DIAPERS) IMPLANT
WATER STERILE IRR 1000ML POUR (IV SOLUTION) ×2 IMPLANT
WIPE INSTRUMENT VISIWIPE 73X73 (MISCELLANEOUS) IMPLANT

## 2013-04-10 NOTE — Transfer of Care (Signed)
Immediate Anesthesia Transfer of Care Note  Patient: Rodney Ayala  Procedure(s) Performed: Procedure(s) with comments: CRANIOTOMY HYPOPHYSECTOMY TRANSNASAL APPROACH (N/A) - Transphenoidal resection of pituitary tumor TRANSNASAL APPROACH (N/A)  Patient Location: PACU  Anesthesia Type:General  Level of Consciousness: awake  Airway & Oxygen Therapy: Patient Spontanous Breathing and Patient connected to face mask oxygen  Post-op Assessment: Report given to PACU RN, Post -op Vital signs reviewed and stable and Patient moving all extremities X 4  Post vital signs: Reviewed and stable  Complications: No apparent anesthesia complications

## 2013-04-10 NOTE — Preoperative (Signed)
Beta Blockers   Reason not to administer Beta Blockers:Not Applicable 

## 2013-04-10 NOTE — Progress Notes (Signed)
Subjective: Patient resting comfortably in bed. Postoperative BMET looks good. Continues on IV drip with 100 mg/L hydrocortisone at 100 cc per hour.  Objective: Vital signs in last 24 hours: Filed Vitals:   04/10/13 1130 04/10/13 1200 04/10/13 1300 04/10/13 1400  BP: 150/90 153/80 143/88   Pulse: 53 49 52 57  Temp: 97.2 F (36.2 C)     TempSrc:      Resp: 27 14 9 10   Height:  5\' 8"  (1.727 m)    Weight:  71.6 kg (157 lb 13.6 oz)    SpO2: 100% 100% 99% 99%    Intake/Output from previous day:   Intake/Output this shift: Total I/O In: 1476.7 [I.V.:1476.7] Out: 1395 [Urine:1370; Blood:25]  Physical Exam:  Patient awake and following commands. Oriented x3. Moving all 4 extremities well.  BMET  Recent Labs  04/10/13 1130  NA 139  K 3.7  CL 108  CO2 24  GLUCOSE 148*  BUN 8  CREATININE 0.65  CALCIUM 8.3*    Assessment/Plan: Doing well following transsphenoidal resection of pituitary tumor today. Case discussed with Dr. Billy Fischer (CCM) who contacted Dr. Debara Pickett, requesting endocrinology consultation for panhypopituitarism. Dr. Sharl Ma to see the patient later today, we appreciate his assistance. Records left for his review. If patient is doing well in a.m., we'll begin to advance diet and begin out of bed.   Hewitt Shorts, MD 04/10/2013, 2:19 PM

## 2013-04-10 NOTE — H&P (Signed)
Subjective: Patient is a 62 y.o. male who is admitted for treatment of pituitary tumor.  Patient was found and routine optometry evaluation to have visual field defect, underwent ophthalmologic evaluation was found to bitemporal hemianopsia, worse in the superior as compared to the inferior quadrant. MRI of the brain revealed that pituitary tumor. Patient had previously been started on testosterone injections every 2 weeks for hypogonadism. 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 Patient divided visual complaints including but mild diplopia, blurry vision, or headaches.  Patient is admitted now for transsphenoidal resection of pituitary tumor.    Past Medical History  Diagnosis Date  . Arthritis   . Hep C w/o coma, chronic     Past Surgical History  Procedure Laterality Date  . No past surgeries      Prescriptions prior to admission  Medication Sig Dispense Refill  . cyclobenzaprine (FLEXERIL) 10 MG tablet Take 10 mg by mouth at bedtime.      . diclofenac (VOLTAREN) 50 MG EC tablet Take 50 mg by mouth 2 (two) times daily.      . diphenhydrAMINE (BENADRYL) 25 MG tablet Take 25 mg by mouth as needed for itching.      . docusate sodium (COLACE) 100 MG capsule Take 200 mg by mouth daily.      Marland Kitchen lovastatin (MEVACOR) 20 MG tablet Take 20 mg by mouth at bedtime.      Marland Kitchen testosterone cypionate (DEPOTESTOTERONE CYPIONATE) 200 MG/ML injection Inject 200 mg into the muscle every 14 (fourteen) days. Due for injection 04/02/13 or 04/03/13       No Known Allergies  History  Substance Use Topics  . Smoking status: Current Every Day Smoker -- 1.00 packs/day for 45 years    Types: Cigarettes  . Smokeless tobacco: Not on file  . Alcohol Use: Yes     Comment: quit x 4 yrs    History reviewed. No pertinent family history.   Review of Systems A comprehensive review of systems was negative.  Objective: Vital signs in last 24 hours: Temp:  [97.4 F (36.3 C)] 97.4 F (36.3 C) (07/24  0611) Pulse Rate:  [57] 57 (07/24 0611) Resp:  [18] 18 (07/24 0611) BP: (124)/(67) 124/67 mmHg (07/24 0611) SpO2:  [100 %] 100 % (07/24 1610)  EXAM: Patient is a well-developed well-nourished male in no acute distress. Lungs are clear to auscultation , the patient has symmetrical respiratory excursion. Heart has a regular rate and rhythm normal S1 and S2 no murmur.   Abdomen is soft nontender nondistended bowel sounds are present. Extremity examination shows no clubbing cyanosis or edema.  Mental status examination shows the patient to be awake, alert, fully oriented. Speech is fluent. He has good comprehension. Cranial nerve examination shows pupils are equal, round, and reactive to light bilaterally, 2 and a half millimeters bilaterally. Extraocular movements are intact. Facial movement is symmetrical. Palatal movement is symmetrical. Shoulder shrug is symmetrical. Tongue is midline.  Motor examination shows 5 over 5 strength in the upper extremities including the deltoid biceps triceps and intrinsics and grip. Sensation is intact to pinprick throughout the digits of the upper extremities. Reflexes are symmetrical and without evidence of pathologic reflexes. Patient has a normal gait and stance.   Data Review:CBC    Component Value Date/Time   WBC 9.0 04/02/2013 1417   RBC 4.47 04/02/2013 1417   HGB 14.0 04/02/2013 1417   HCT 37.8* 04/02/2013 1417   PLT 248 04/02/2013 1417   MCV 84.6 04/02/2013 1417   MCH  31.3 04/02/2013 1417   MCHC 37.0* 04/02/2013 1417   RDW 13.6 04/02/2013 1417   LYMPHSABS 3.3 04/19/2011 1056   MONOABS 0.3 04/19/2011 1056   EOSABS 0.4 04/19/2011 1056   BASOSABS 0.0 04/19/2011 1056                          BMET    Component Value Date/Time   NA 138 04/02/2013 1417   K 3.8 04/02/2013 1417   CL 104 04/02/2013 1417   CO2 26 04/02/2013 1417   GLUCOSE 89 04/02/2013 1417   BUN 11 04/02/2013 1417   CREATININE 0.78 04/02/2013 1417   CREATININE 0.69 02/09/2011 1355   CALCIUM 9.4  04/02/2013 1417   GFRNONAA >90 04/02/2013 1417   GFRAA >90 04/02/2013 1417     Assessment/Plan: Patient with a pituitary macroadenoma, with a history of hypothyroidism, treated with testosterone injections, and 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 Patient is admitted now for transsphenoidal resection of pituitary tumor in a combined fashion between the ENT service (Dr. Suzanna Obey) and the neurosurgical service (Dr. Shirlean Kelly). Discussed nature of his condition, the nature the surgical procedure, and its risks including risk of infection, including meningitis and sinusitis; the risk of bleeding and possible need for transfusion, the risk of catastrophic bleeding; the risk of neurologic dysfunction including loss of vision, paralysis, coma, and death; the risk of endocrinopathy including diabetes insipidus; and anesthetic risks of myocardial infarction, stroke, pneumonia, and death. Understanding all this patient is to proceed with surgery. He has seen Dr. Suzanna Obey in the ENT consultation preoperatively.   Hewitt Shorts, MD 04/10/2013 7:19 AM

## 2013-04-10 NOTE — Op Note (Signed)
Preop/postop Diagnosis: Pituitary tumor Procedure: Transsphenoidal hypophysectomy with fusion guidance Anesthesia: Gen. Estimated blood loss less than 10 cc Indications: 62 year old with a history of a pituitary tumor that has had workup and decision has been made for surgery. The nasal surgery component of exposure was discussed with the patient regarding the approach to the pituitary tumor. Risks, benefits, and options were discussed and all questions are answered and consent was obtained. Procedure: Patient was taken to the operating room placed in the supine position after general endotracheal tube anesthesia was prepped and draped in the usual sterile manner. The fusion guidance Medtronic system was positioned calibrated with excellent accuracy. The oxymetazoline pledgets were placed in the nose and then the septum floor the nose and columella reinjected with 1% lidocaine with 1 100,000 epinephrine a reverse call weaning incision was made at the base of the columella and the columella was dissected. Dissection was carried toward the right side removing the medial crura from its attachments. The cartilage was then exposed by making a to incision along the perichondrium dissecting the septum posteriorly. Divided about 2 cm posterior to the caudal strut and then the septum was taken off its nasal spine. Dissection in the floor and lateral left aspect allow the septum to swing into the left side of the nasal cavity. The floor was dissected and the flap was elevated posteriorly along the septum and post bony and cartilaginous and the floor. The bony portion of the septum was followed back to the sphenoid rostrum. The Hardy retractor was inserted. The bone was preserved for use of reconstruction. The bones and taken off the sphenoid rostrum in the sphenoid oh was easily opened as it was thin bone with the [facility] forceps. The bone was opened widely. The using the fusion guidance system the sella was easily  identified. The mucosa was taken off the sphenoid surface. The procedure was then taken over by Dr. Newell Coral to be dictated in a separate operative report. The flaps were then closed over the sphenoid after placing a piece of cartilage. The columella was repositioned back in its anatomic position. 4-0 chromic was used to position the medial crura and the white male incision in its anatomic position. The hemitransfixion incision closed with interrupted 4-0 chromic. A quilting plain gut suture was placed to the septum. The incision was closed with interrupted 5-0 nylon. A Telfa rolls soaked in bacitracin was placed in each side of the nose and secured with a 3-0 nylon. The patient is an awake and brought to cover stable condition counts correct

## 2013-04-10 NOTE — Consult Note (Addendum)
Reason for Consult: panhypopituitarism Referring Physician: Dr. Felicita Gage is an 62 y.o. male.   History of present illness:  Rodney Ayala describes testosterone replacement therapy for the past 3 months.  According to Dr. Newell Coral, a recent optometry visit revealed a visual field defect.  Subsequent ophthalmologic evaluation revealed bitemporal hemianopsia.  MRI revealed the patient's pituitary tumor.  Today, he received transsphenoidal resection of the pituitary tumor.  During our post-operative hospital visit, [name] reports that he felt "fine" prior to his surgery.    Past Medical History  Diagnosis Date  . Arthritis   . Hep C w/o coma, chronic   . Panhypopituitarism     Past Surgical History  Procedure Laterality Date  . No past surgeries      History reviewed. No pertinent family history.  No known family history of pituitary tumors or other endocrine neoplasia.    Social History: He uses tobacco.  He reports that he does not drink alcohol (including beer, wine, or liquor).    Allergies: No Known Allergies  Medications: I reviewed medications prior to admission and current medications.  [name] reports that he has not been taking thyroid hormone or corticosteroids.    ROS General: No weight loss, but weight gain. Endocrine:  Though he denies polydipsia or polyuria during the day, he describes nocturia 2 or 3 times per night; no cold intolerance, no heat intolerance. Cardiovascular: No chest pain, no edema. Respiratory: No dyspnea. Gastrointestinal: No abdominal pain, no nausea, no vomiting, no diarrhea, no constipation. Neurologic: No tremor, no numbness. Psychiatric: No anxiety, no depression. Skin: No rash. Musculoskeletal: No muscle weakness, no muscle aches.  Blood pressure 143/93, pulse 59, temperature 97.2 F (36.2 C), temperature source Oral, resp. rate 10, height 5\' 8"  (1.727 m), weight 71.6 kg (157 lb 13.6 oz), SpO2 100.00%.  Physical  Exam General: No apparent distress, lying quietly in bed. Eyes: Pupils equal and round, extraocular eye movements seem intact. Neck: Supple, trachea midline. Thyroid: No appreciable enlargement, but thyroid is mostly substernal while he is lying supine. Cardiovascular: Regular rhythm and rate, no edema, normal radial pulses. Respiratory: Normal respiratory effort, clear to auscultation. Gastrointestinal: Nontender abdomen without distention or appreciable hepatomegaly. Neurologic: Deep tendon reflexes 1+ (symmetric hyporeflexia consistent with hypothyroidism). Musculoskeletal: Normal muscle tone, no muscle atrophy. Skin: Appropriate warmth versus mildly cool, no pathologic hyperpigmentation. Mental status: Somnolent but conversant, no hallucinations or delusions evident. Hematologic/lymphatic: No palpable cervical adenopathy, no visible ecchymoses or petechia.  Results for orders placed during the hospital encounter of 04/10/13 (from the past 48 hour(s))  BASIC METABOLIC PANEL     Status: Abnormal   Collection Time    04/10/13 11:30 AM      Result Value Range   Sodium 139  135 - 145 mEq/L   Potassium 3.7  3.5 - 5.1 mEq/L   Chloride 108  96 - 112 mEq/L   CO2 24  19 - 32 mEq/L   Glucose, Bld 148 (*) 70 - 99 mg/dL   BUN 8  6 - 23 mg/dL   Creatinine, Ser 1.61  0.50 - 1.35 mg/dL   Calcium 8.3 (*) 8.4 - 10.5 mg/dL   GFR calc non Af Amer >90  >90 mL/min   GFR calc Af Amer >90  >90 mL/min   Comment:            The eGFR has been calculated     using the CKD EPI equation.     This  calculation has not been     validated in all clinical     situations.     eGFR's persistently     <90 mL/min signify     possible Chronic Kidney Disease.   Jan 29, 2013 at 8:39AM: WBC 8.1, Hemoglobin 14.2, MCV 87, platelets 257, eosinophils 0.4 sodium 139, potassium 4.4, glucose 85, creatinine 0.8, calcium 9.4, albumin 4.2 FSH <0.3 mIU/mL LH <0.1 mIU/mL Prolactin 65.3 ng/mL (reference range  2.1-17.1) Cortisol 1.3 ug/dL  ACTH 19 pg/mL (reference range 10-46) IGF-I <25 ng/mL  Feb 06, 2013:   TSH 3.602 uIU/mL Total T4 2.4 ug/dL (reference range 1-61.5)  February 24, 2013:  MRI of brain without and with contrast:  Consistent with 13 mm x 19 mm pituitary macroadenoma with compression of optic chiasm and "probable early invasion of the cavernous sinus bilaterally".  [I reviewed the radiology report and images.]  Dg C-arm 1-60 Min-no Report  04/10/2013   CLINICAL DATA: Transphenoidal for resection of pituitary tumor   C-ARM 1-60 MINUTES  Fluoroscopy was utilized by the requesting physician.  No radiographic  interpretation.     Assessment/Plan:  1.  Panhypopituitarism  A. Central hypothyroidism.  B. Growth hormone deficiency.  C. Central (hypogonadotropic) hypogonadism.  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  2.  Pituitary tumor, status post transsphenoidal resection.    3.  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 Newell Coral today.    I agree with his plan to use hydrocortisone at a stress dose, then taper down the dose at his discretion. Based upon the patient's current body surface area, [name] maintenance dose of hydrocortisone will likely be in the range of 20 to 25 mg per day (in divided doses).  At the time of hospital discharge, I recommend hydrocortisone 20 mg by mouth twice a day until his outpatient visit with me.  Thyroid hormone increases the metabolism of corticosteroids.  Therefore, it has been recommended to postpone levothyroxine replacement therapy for a few days after starting adrenal replacement therapy.  As an outpatient, we can address growth hormone deficiency and hypogonadism.    I agree with the plan to monitor for central diabetes insipidus by: 1. hourly measurement of urine output 2. urine specific gravity every 4 hours 3. blood  chemistries and serum osmolality at least once a day 4. daily measurement of body weight  [name] agreed to an office visit with me at: [facility] @ [place] [address], [address] [place], [place] [address] Phone: [phone] Fax:  [phone]   Rodney Ayala 04/10/2013, 5:47 PM

## 2013-04-10 NOTE — Progress Notes (Signed)
Utilization review completed.  P.J. Tira Lafferty,RN,BSN Case Manager [phone]  

## 2013-04-10 NOTE — Anesthesia Preprocedure Evaluation (Addendum)
Anesthesia Evaluation  Patient identified by MRN, date of birth, ID band Patient awake    Reviewed: Allergy & Precautions, H&P , NPO status , Patient's Chart, lab work & pertinent test results  History of Anesthesia Complications Negative for: history of anesthetic complications  Airway Mallampati: I  Neck ROM: Full    Dental  (+) Edentulous Upper and Edentulous Lower   Pulmonary neg pulmonary ROS,  breath sounds clear to auscultation        Cardiovascular negative cardio ROS  Rhythm:Regular Rate:Normal     Neuro/Psych Pituitary tumor negative neurological ROS     GI/Hepatic (+) Hepatitis -, C  Endo/Other    Renal/GU      Musculoskeletal   Abdominal   Peds  Hematology   Anesthesia Other Findings   Reproductive/Obstetrics                        Anesthesia Physical Anesthesia Plan  ASA: II  Anesthesia Plan: General   Post-op Pain Management:    Induction: Intravenous  Airway Management Planned: Oral ETT  Additional Equipment: Arterial line  Intra-op Plan:   Post-operative Plan: Extubation in OR  Informed Consent:   Dental advisory given  Plan Discussed with: CRNA and Surgeon  Anesthesia Plan Comments:        Anesthesia Quick Evaluation

## 2013-04-10 NOTE — Anesthesia Procedure Notes (Signed)
Procedure Name: Intubation Date/Time: 04/10/2013 7:52 AM Performed by: Quentin Ore Pre-anesthesia Checklist: Patient identified, Emergency Drugs available, Suction available, Patient being monitored and Timeout performed Patient Re-evaluated:Patient Re-evaluated prior to inductionOxygen Delivery Method: Circle system utilized Preoxygenation: Pre-oxygenation with 100% oxygen Intubation Type: IV induction Ventilation: Mask ventilation without difficulty and Oral airway inserted - appropriate to patient size Laryngoscope Size: Mac and 3 Grade View: Grade I Tube type: Oral Tube size: 7.5 mm Number of attempts: 1 Airway Equipment and Method: Stylet and LTA kit utilized Placement Confirmation: ETT inserted through vocal cords under direct vision,  positive ETCO2 and breath sounds checked- equal and bilateral Secured at: 23 cm Tube secured with: Tape Dental Injury: Teeth and Oropharynx as per pre-operative assessment

## 2013-04-10 NOTE — Progress Notes (Signed)
Dr. Newell Coral left medical records for Debara Pickett, MD (endocrinologist) to be picked up this afternoon.

## 2013-04-10 NOTE — Anesthesia Postprocedure Evaluation (Signed)
  Anesthesia Post-op Note  Patient: Rodney Ayala  Procedure(s) Performed: Procedure(s) with comments: CRANIOTOMY HYPOPHYSECTOMY TRANSNASAL APPROACH (N/A) - Transphenoidal resection of pituitary tumor TRANSNASAL APPROACH (N/A)  Patient Location: PACU  Anesthesia Type:General  Level of Consciousness: awake and sedated  Airway and Oxygen Therapy: Patient Spontanous Breathing  Post-op Pain: mild  Post-op Assessment: Post-op Vital signs reviewed  Post-op Vital Signs: stable  Complications: No apparent anesthesia complications

## 2013-04-10 NOTE — Op Note (Signed)
04/10/2013  11:27 AM  PATIENT:  Rodney Ayala  62 y.o. male  PRE-OPERATIVE DIAGNOSIS:  pituitary tumor  POST-OPERATIVE DIAGNOSIS:  pituitary tumor  PROCEDURE:  Procedure(s): CRANIOTOMY HYPOPHYSECTOMY TRANSNASAL APPROACH: Transsphenoidal resection of pituitary tumor with harvesting of fat graft from the abdominal wall and intraoperative frameless stereotaxis  SURGEON:  Surgeon(s): Hewitt Shorts, MD Suzanna Obey, MD  ANESTHESIA:   general  EBL:  Total I/O In: 1900 [I.V.:1900] Out: 825 [Urine:800; Blood:25]  BLOOD ADMINISTERED:none  COUNT: Correct per nursing staff  SPECIMEN: Pituitary tumor for permanent section  DICTATION: Patient was brought to surgery for transsphenoidal resection of the pituitary tumor. The procedure was done a combined fashion between the neurosurgical  (Dr. Shirlean Kelly) and ENT (Dr. Suzanna Obey) services. This is a patient of the neurosurgical portion of the procedure. Patient is brought to the operating room, placed under general ventricular anesthesia. Patient was placed in a horseshoe head rest, with the head gently tilted to the left and gently flexed. Dr. Jearld Fenton to done the preoperative stealth planning, a C-arm fluoroscope was also set up for intraoperative use if needed. Dr. Jearld Fenton performed the transnasal exposure into the sphenoid sinus, and this portion of procedure is dictated by Dr. Jearld Fenton. Intraoperative care was transferred to me once exposure of the sinus and anterior wall of the sella was achieved. The operating microscope aortic and draped and brought into the field. We visualized the anterior wall of the sella this was confirmed with the Stealth stereotaxis which was used throughout the resection portion of the procedure. The intraoral the sella was very free and were able to remove the remaining bone using a Engineer, manufacturing systems punches. A 23-gauge spinal middle was used to puncture the anterior dura, and we gently aspirated with a  tuberculin syringe, no blood or fluid was drawn out. We then opened the anterior dura of the sella in an x-ray fashion, and soft tumor was immediately identified. Specimens the tumor were obtained and sent to pathology for permanent examination. We used a variety of angled small looped curettes to perform the tumor resection. There were able to remove the tumor in all directions including inferiorly, laterally, and superiorly. As the tumor resection was performed we visualized the gland posteriorly. Again the intraoperative sets axis was used to confirm our positioning within the sella. In the end a gross total resection of the tumor was achieved. Hemostasis was established the use of Gelfoam with thrombin, there was removed. We had prepped the abdomen and advanced with DuraPrep. We infiltrated the skin with local site with epinephrine and then made a 1 inch incision in the right to the abdomen. Dissection was carried down through the subcutaneous tissue and fat graft was obtained for implantation within the sella. Hemostasis of the use of a electrocautery, and once this was established, we proceeded with closure. The subcutaneous and subcuticular closed interrupted inverted 3-0 Vicryl sutures, the skin is approximate Dermabond. We packed fat graft in the tumor resection cavity, and then selected a piece of nasal cartilage to reconstruct the anterior wall of the sella. This was positioned just behind the bony edges of the bony opening of the sella wall. The surgical procedure at this point was then transferred back to Dr. Jearld Fenton for closure of the approach, and is dictated by Dr. Jearld Fenton. Following surgery the patient was reversed an anesthetic, extubated, and transferred to the recovery room for further care we then remove all 4 extremities.  PLAN OF CARE: Admit to  inpatient   PATIENT DISPOSITION:  PACU - hemodynamically stable.   Delay start of Pharmacological VTE agent (>24hrs) due to surgical blood loss or  risk of bleeding:  yes

## 2013-04-11 LAB — BASIC METABOLIC PANEL
BUN: 6 mg/dL (ref 6–23)
CO2: 23 mEq/L (ref 19–32)
Calcium: 8.3 mg/dL — ABNORMAL LOW (ref 8.4–10.5)
Chloride: 102 mEq/L (ref 96–112)
Creatinine, Ser: 0.65 mg/dL (ref 0.50–1.35)
GFR calc Af Amer: >90 mL/min (ref >90)
GFR calc non Af Amer: >90 mL/min (ref >90)
Glucose, Bld: 137 mg/dL — ABNORMAL HIGH (ref 70–99)
Potassium: 3.5 mEq/L (ref 3.5–5.1)
Sodium: 135 mEq/L (ref 135–145)

## 2013-04-11 LAB — CBC WITH DIFFERENTIAL/PLATELET
Basophils Absolute: 0 10*3/uL (ref 0.0–0.1)
Basophils Relative: 0 % (ref 0–1)
Eosinophils Absolute: 0 10*3/uL (ref 0.0–0.7)
Eosinophils Relative: 0 % (ref 0–5)
HCT: 38.8 % — ABNORMAL LOW (ref 39.0–52.0)
Hemoglobin: 14.1 g/dL (ref 13.0–17.0)
Lymphocytes Relative: 10 % — ABNORMAL LOW (ref 12–46)
Lymphs Abs: 1.4 10*3/uL (ref 0.7–4.0)
MCH: 30.5 pg (ref 26.0–34.0)
MCHC: 36.3 g/dL — ABNORMAL HIGH (ref 30.0–36.0)
MCV: 84 fL (ref 78.0–100.0)
Monocytes Absolute: 0.6 10*3/uL (ref 0.1–1.0)
Monocytes Relative: 4 % (ref 3–12)
Neutro Abs: 12.3 10*3/uL — ABNORMAL HIGH (ref 1.7–7.7)
Neutrophils Relative %: 86 % — ABNORMAL HIGH (ref 43–77)
Platelets: 240 10*3/uL (ref 150–400)
RBC: 4.62 MIL/uL (ref 4.22–5.81)
RDW: 13.7 % (ref 11.5–15.5)
WBC: 14.3 10*3/uL — ABNORMAL HIGH (ref 4.0–10.5)

## 2013-04-11 LAB — OSMOLALITY
Osmolality: 281 mOsm/kg (ref 275–300)
Osmolality: 281 mOsm/kg (ref 275–300)

## 2013-04-11 LAB — T4, FREE: Free T4: 0.82 ng/dL (ref 0.80–1.80)

## 2013-04-11 MED ORDER — HYDROCODONE-ACETAMINOPHEN 5-325 MG PO TABS
1.0000 | ORAL_TABLET | ORAL | Status: DC | PRN
  Administered 2013-04-11 – 2013-04-13 (×4): 2 via ORAL
  Filled 2013-04-11 (×4): qty 2

## 2013-04-11 MED ORDER — CEFAZOLIN SODIUM 1-5 GM-% IV SOLN
1.0000 g | Freq: Three times a day (TID) | INTRAVENOUS | Status: DC
  Administered 2013-04-11 – 2013-04-13 (×7): 1 g via INTRAVENOUS
  Filled 2013-04-11 (×8): qty 50

## 2013-04-11 MED ORDER — LEVOTHYROXINE SODIUM 100 MCG IV SOLR
100.0000 ug | Freq: Once | INTRAVENOUS | Status: AC
  Administered 2013-04-11: 100 ug via INTRAVENOUS
  Filled 2013-04-11: qty 5

## 2013-04-11 MED ORDER — LEVOTHYROXINE SODIUM 75 MCG PO TABS
75.0000 ug | ORAL_TABLET | Freq: Every day | ORAL | Status: DC
  Administered 2013-04-12 – 2013-04-13 (×2): 75 ug via ORAL
  Filled 2013-04-11 (×5): qty 1

## 2013-04-11 NOTE — Progress Notes (Signed)
Subjective: Patient resting in bed comfortably, some soreness around the nasal area. Urine outputs and specific gravities have been stable, as has his serum sodium.  Objective: Vital signs in last 24 hours: Filed Vitals:   04/11/13 0407 04/11/13 0500 04/11/13 0600 04/11/13 0700  BP:  126/69 117/69 129/66  Pulse:  55 58 57  Temp: 98.4 F (36.9 C)     TempSrc: Axillary     Resp:  10 15 14   Height:      Weight:      SpO2:  100% 100% 100%    Intake/Output from previous day: 07/24 0701 - 07/25 0700 In: 3051.7 [I.V.:2851.7; IV Piggyback:200] Out: 3705 [Urine:3680; Blood:25] Intake/Output this shift:    Physical Exam:  Awake and alert, following commands. Vision good. Moving all 4 extremities well.  CBC  Recent Labs  04/11/13 0600  WBC 14.3*  HGB 14.1  HCT 38.8*  PLT 240   BMET  Recent Labs  04/10/13 2100 04/11/13 0600  NA 133* 135  K 3.7 3.5  CL 102 102  CO2 24 23  GLUCOSE 150* 137*  BUN 5* 6  CREATININE 0.59 0.65  CALCIUM 8.4 8.3*    Assessment/Plan: Doing well following transsphenoidal resection of pituitary tumor yesterday. Vision good. No diabetes insipidus yet. We'll begin to progress and mobilize.  Will begin to taper hydrocortisone drip, decreasing to 75 cc per hour now, and 50 cc per hour at 1900. Will begin out of bed to chair, and progressed ambulation in the ICU 3 times a day. Will leave Foley to continue to monitor urine output closely. Will begin clear liquids and advanced to regular diet. Will DC aline. Will begin by mouth analgesics.  Appreciate the assistance of Dr. Debara Pickett and agree with plan to begin thyroid axis replacement.   Hewitt Shorts, MD 04/11/2013, 7:58 AM

## 2013-04-11 NOTE — Progress Notes (Addendum)
Subjective: Rodney Ayala reports feeling better today than last evening.  He has a headache across the front and back of his head.  He hopes to have his nasal packing removed soon (but also understands to leave this in place until his surgeon had addressed this).    According to his nurse, he has been somewhat somnolent, his urine output has not been excessive, and his urine specific gravity has remained appropriate.   Review of systems:  No vomiting.  Cold intolerance since childhood.    Medications: I reviewed current medication list.  Objective: Vital signs in last 24 hours: Temp:  [95.9 F (35.5 C)-98.4 F (36.9 C)] 98.4 F (36.9 C) (07/25 0407) Pulse Rate:  [49-62] 57 (07/25 0700) Resp:  [9-27] 14 (07/25 0700) BP: (117-153)/(66-93) 129/66 mmHg (07/25 0700) SpO2:  [99 %-100 %] 100 % (07/25 0700) Arterial Line BP: (153-170)/(61-78) 156/61 mmHg (07/25 0700) Weight:  [71.6 kg (157 lb 13.6 oz)] 71.6 kg (157 lb 13.6 oz) (07/24 1200) Weight change:     Intake/Output from previous day: 07/24 0701 - 07/25 0700 In: 2976.7 [I.V.:2776.7; IV Piggyback:200] Out: 3570 [Urine:3545; Blood:25] Intake/Output this shift:   General: No apparent distress. Eyes: Pupils equal and round. Neck: Supple, trachea midline. Cardiovascular: Regular rhythm, though slow rate, no pitting edema, normal radial pulses. Respiratory: Normal respiratory effort, clear to auscultation. Gastrointestinal: Nontender abdomen without distention or appreciable hepatomegaly. Neurologic: Deep tendon reflexes diminished (seems consistent with hypothyroidism) Musculoskeletal: Normal muscle tone, no muscle atrophy. Skin:  Cool except in areas of passive rewarming, no pathologic hyperpigmentation. Mental status: Less somnolent, more conversant, speech clear, no hallucinations or delusions evident. Hematologic/lymphatic: No visible ecchymoses (apart from venipuncture sites), no visible petechia.   Lab Results:  Recent Labs  04/11/13 0600  WBC 14.3*  HGB 14.1  HCT 38.8*  PLT 240   BMET  Recent Labs  04/10/13 1130 04/10/13 2100  NA 139 133*  K 3.7 3.7  CL 108 102  CO2 24 24  GLUCOSE 148* 150*  BUN 8 5*  CREATININE 0.65 0.59  CALCIUM 8.3* 8.4    Assessment/Plan: 1.  Panhypopituitarism.  A.  Central hypothyroidism.  B.  Growth hormone deficiency.  C.  Central hypogonadism.  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 3.  Hyponatremia--mild, developed after several hours of appropriate hydrocortisone therapy, SIADH seems unlikely--hypothyroidism and mild hyperglycemia likely contributed to this (his sodium level corrected for glucose was 134). 4.  Low body temperature--improved with passive rewarming--likely due to central hypothyroidism. 5.  Bradycardia--likely due to his central hypothyroidism.  No evidence of central diabetes insipidus at this time.    I am concerned about his hyponatremia, bradycardia, low body temperature, and what might be excessive somnolence/slow mentation.  Though not frankly in myxedema coma, he has evidence of chronic central hypothyroidism, received surgery, and now has therapy with opiate analgesics.    Informed consent obtained after discussion of anticipated benefits, alternatives, nature of therapy, and risks--including but not limited to fast heart beat, irregular heart beat, etc. from levothyroxine.   Recommendations: 1.  Continue hydrocortisone, but taper down at the discretion of his neurosurgeon. 2.  Continue monitoring for central diabetes insipidus. 3.  Start levothyroxine with 100 micrograms intravenous once today (as a moderate loading dose), followed by 75 micrograms by mouth once a day on an empty stomach starting tomorrow.    I discussed the care plan with Dr. Newell Coral.  LOS: 1 day   Shaylan Tutton 04/11/2013, 7:21 AM

## 2013-04-12 LAB — BASIC METABOLIC PANEL
BUN: 10 mg/dL (ref 6–23)
Calcium: 8.3 mg/dL — ABNORMAL LOW (ref 8.4–10.5)
Creatinine, Ser: 0.7 mg/dL (ref 0.50–1.35)
GFR calc Af Amer: >90 mL/min (ref >90)
GFR calc non Af Amer: >90 mL/min (ref >90)
Glucose, Bld: 129 mg/dL — ABNORMAL HIGH (ref 70–99)
Potassium: 3.5 mEq/L (ref 3.5–5.1)

## 2013-04-12 MED ORDER — HYDROCORTISONE 20 MG PO TABS: 20.0000 mg | ORAL_TABLET | Freq: Two times a day (BID) | ORAL | Status: DC

## 2013-04-12 MED ORDER — CEPHALEXIN 500 MG PO CAPS: 500.0000 mg | ORAL_CAPSULE | Freq: Three times a day (TID) | ORAL | Status: DC

## 2013-04-12 MED ORDER — DOCUSATE SODIUM 100 MG PO CAPS
100.0000 mg | ORAL_CAPSULE | Freq: Two times a day (BID) | ORAL | Status: DC
Start: 2013-04-12 — End: 2013-04-13
  Administered 2013-04-12 – 2013-04-13 (×3): 100 mg via ORAL
  Filled 2013-04-12 (×4): qty 1

## 2013-04-12 MED ORDER — LEVOTHYROXINE SODIUM 75 MCG PO TABS: 75.0000 ug | ORAL_TABLET | Freq: Every day | ORAL | Status: DC

## 2013-04-12 MED ORDER — HYDROCORTISONE 20 MG PO TABS
30.0000 mg | ORAL_TABLET | Freq: Two times a day (BID) | ORAL | Status: AC
  Administered 2013-04-12 (×2): 30 mg via ORAL
  Filled 2013-04-12 (×2): qty 1

## 2013-04-12 MED ORDER — PANTOPRAZOLE SODIUM 40 MG PO TBEC
40.0000 mg | DELAYED_RELEASE_TABLET | Freq: Every day | ORAL | Status: DC
  Administered 2013-04-12 – 2013-04-13 (×2): 40 mg via ORAL
  Filled 2013-04-12 (×2): qty 1

## 2013-04-12 MED ORDER — HYDROCORTISONE 20 MG PO TABS
20.0000 mg | ORAL_TABLET | Freq: Two times a day (BID) | ORAL | Status: DC
  Administered 2013-04-13: 20 mg via ORAL
  Filled 2013-04-12 (×2): qty 1
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
## 2013-04-12 NOTE — Progress Notes (Signed)
Subjective: Patient resting in bed, comfortable. Continuing to taper hydrocortisone drip, tolerating well. Was out of bed to chair yesterday, but did not ambulate because nursing staff was too busy with over assignments and admissions. Feels his appetite is improved, and is eating well. Urine output stable, specific gravity show good concentration, no evidence of diabetes insipidus. Serum sodium improving, temperature stable.  Objective: Vital signs in last 24 hours: Filed Vitals:   04/12/13 0400 04/12/13 0415 04/12/13 0500 04/12/13 0600  BP: 109/48  113/54 103/51  Pulse: 60  58 56  Temp:  97.6 F (36.4 C)    TempSrc:  Oral    Resp: 12  12 13   Height:      Weight:   71 kg (156 lb 8.4 oz)   SpO2: 100%  100% 100%    Intake/Output from previous day: 07/25 0701 - 07/26 0700 In: 3183.3 [P.O.:1680; I.V.:1403.3; IV Piggyback:100] Out: 2515 [Urine:2515] Intake/Output this shift:    Physical Exam:  Awake alert, oriented. Following commands. Speech fluent. Moving all 4 extremities well. Vision good  CBC  Recent Labs  04/11/13 0600  WBC 14.3*  HGB 14.1  HCT 38.8*  PLT 240   BMET  Recent Labs  04/11/13 0600 04/12/13 0535  NA 135 137  K 3.5 3.5  CL 102 104  CO2 23 27  GLUCOSE 137* 129*  BUN 6 10  CREATININE 0.65 0.70  CALCIUM 8.3* 8.3*    Assessment/Plan: Continues to recover well from transsphenoidal surgery. No diabetes insipidus. We'll discontinue Foley catheter, strict inputs and outputs, and urine specific gravities. We'll transition to by mouth cortisone replacement, giving hydrocortisone 30 mg by mouth twice a day today, and then beginning 20 mg by mouth twice a day tomorrow. We'll plan on progressing to ambulation in the ICU today. Case discussed with both my partners Drs. Elsner and Cabbell.  Prescriptions printed for hydrocortisone 20 mg by mouth twice a day, levothyroxine 75 mcg every morning, and Keflex 5 mg 3 times a day for 4 days.  Patient to follow up with  Dr. Debara Pickett for endocrinologic followup Tuesday, July 29 at 8 AM, later that day he is to see Dr. Suzanna Obey for ENT followup, and removal this nasal packing. He is to followup with me in 3 weeks.  Hewitt Shorts, MD 04/12/2013, 8:02 AM

## 2013-04-13 LAB — BASIC METABOLIC PANEL
BUN: 13 mg/dL (ref 6–23)
CO2: 25 mEq/L (ref 19–32)
Calcium: 8.7 mg/dL (ref 8.4–10.5)
Chloride: 104 mEq/L (ref 96–112)
Creatinine, Ser: 0.71 mg/dL (ref 0.50–1.35)
GFR calc Af Amer: >90 mL/min (ref >90)
GFR calc non Af Amer: >90 mL/min (ref >90)
Glucose, Bld: 99 mg/dL (ref 70–99)
Potassium: 3.4 mEq/L — ABNORMAL LOW (ref 3.5–5.1)
Sodium: 138 mEq/L (ref 135–145)

## 2013-04-13 NOTE — Discharge Summary (Signed)
Physician Discharge Summary  Patient ID: Rodney Ayala MRN: [id number] DOB/AGE: Jan 16, 1951 62 y.o.  Admit date: 04/10/2013 Discharge date: 04/13/2013  Admission Diagnoses: Pituitary tumor, pan hypopituitarism, visual field defect  Discharge Diagnoses: Pituitary tumor, panhypopituitarism, hypothyroidism, visual field defect, hyponatremia Active Problems:   * No active hospital problems. *   Discharged Condition: good  Hospital Course: Patient was admitted to undergo resection of the large pituitary tumor that was causing a bitemporal hemianopsia. Workup also demonstrated the patient had panhypopituitarism. He was hypothyroid. During the postoperative stay he was hyponatremic. All these problems were corrected with the help of Dr. Sharl Ma from endocrinology. The surgery was done with Dr. Suzanna Obey to provide the approach and closure to the pituitary fossa.  Consults: Dr. Talmage Coin endocrinology, Dr. Suzanna Obey your nose and throat surgery  Significant Diagnostic Studies: None  Treatments: surgery: Transsphenoidal resection of pituitary tumor  Discharge Exam: Blood pressure 131/77, pulse 54, temperature 97.9 F (36.6 C), temperature source Oral, resp. rate 17, height 5\' 8"  (1.727 m), weight 71.5 kg (157 lb 10.1 oz), SpO2 100.00%. Alert oriented. Visual field defects still present at time of discharge. Ambulatory.  Disposition: Discharge home  Discharge Orders   Future Orders Complete By Expires     Call MD for:  difficulty breathing, headache or visual disturbances  As directed     Call MD for:  persistant nausea and vomiting  As directed     Call MD for:  redness, tenderness, or signs of infection (pain, swelling, redness, odor or green/yellow discharge around incision site)  As directed     Call MD for:  severe uncontrolled pain  As directed     Call MD for:  temperature >100.4  As directed     Diet - low sodium heart healthy  As directed     Increase activity slowly  As directed          Medication List         cephALEXin 500 MG capsule  Commonly known as:  KEFLEX  Take 1 capsule (500 mg total) by mouth 3 (three) times daily.     cyclobenzaprine 10 MG tablet  Commonly known as:  FLEXERIL  Take 10 mg by mouth at bedtime.     diclofenac 50 MG EC tablet  Commonly known as:  VOLTAREN  Take 50 mg by mouth 2 (two) times daily.     diphenhydrAMINE 25 MG tablet  Commonly known as:  BENADRYL  Take 25 mg by mouth as needed for itching.     docusate sodium 100 MG capsule  Commonly known as:  COLACE  Take 200 mg by mouth daily.     hydrocortisone 20 MG tablet  Commonly known as:  CORTEF  Take 1 tablet (20 mg total) by mouth 2 (two) times daily.     levothyroxine 75 MCG tablet  Commonly known as:  SYNTHROID  Take 1 tablet (75 mcg total) by mouth daily before breakfast.     lovastatin 20 MG tablet  Commonly known as:  MEVACOR  Take 20 mg by mouth at bedtime.     testosterone cypionate 200 MG/ML injection  Commonly known as:  DEPOTESTOTERONE CYPIONATE  Inject 200 mg into the muscle every 14 (fourteen) days. Due for injection 04/02/13 or 04/03/13           Follow-up Information   Follow up with KERR,JEFFREY, MD On 04/15/2013. (8 AM)    Contact information:   [address] [place]  [address] [phone]       Follow up with Suzanna Obey, MD. Schedule an appointment as soon as possible for a visit on 04/15/2013.   Contact information:   [address] [place] [address] [phone]       Follow up with Hewitt Shorts, MD. Schedule an appointment as soon as possible for a visit in 3 weeks.   Contact information:   [address] [place] [address] [phone]       Signed: Stefani Dama 04/13/2013, 9:44 AM

## 2013-04-14 ENCOUNTER — Encounter (HOSPITAL_COMMUNITY): Payer: Self-pay | Admitting: Neurosurgery

## 2013-06-26 ENCOUNTER — Other Ambulatory Visit: Payer: Self-pay | Admitting: Neurosurgery

## 2013-06-26 DIAGNOSIS — D497 Neoplasm of unspecified behavior of endocrine glands and other parts of nervous system: Secondary | ICD-10-CM

## 2013-07-03 ENCOUNTER — Ambulatory Visit
Admission: RE | Admit: 2013-07-03 | Discharge: 2013-07-03 | Disposition: A | Discharge status: Home / Self Care | Payer: Medicaid Other | Source: Ambulatory Visit | Attending: Neurosurgery | Admitting: Neurosurgery

## 2013-07-03 DIAGNOSIS — D497 Neoplasm of unspecified behavior of endocrine glands and other parts of nervous system: Secondary | ICD-10-CM

## 2013-07-03 MED ORDER — GADOBENATE DIMEGLUMINE 529 MG/ML IV SOLN
7.0000 mL | Freq: Once | INTRAVENOUS | Status: AC | PRN
  Administered 2013-07-03: 7 mL via INTRAVENOUS

## 2013-08-11 ENCOUNTER — Other Ambulatory Visit: Payer: Self-pay | Admitting: Family Medicine

## 2014-03-14 ENCOUNTER — Encounter (HOSPITAL_COMMUNITY): Payer: Self-pay | Admitting: Emergency Medicine

## 2014-03-14 ENCOUNTER — Emergency Department (HOSPITAL_COMMUNITY): Payer: Medicaid Other

## 2014-03-14 ENCOUNTER — Emergency Department (HOSPITAL_COMMUNITY)
Admission: EM | Admit: 2014-03-14 | Discharge: 2014-03-14 | Disposition: A | Discharge status: Home / Self Care | Payer: Medicaid Other | Attending: Emergency Medicine | Admitting: Emergency Medicine

## 2014-03-14 DIAGNOSIS — R1013 Epigastric pain: Secondary | ICD-10-CM | POA: Insufficient documentation

## 2014-03-14 DIAGNOSIS — Z8639 Personal history of other endocrine, nutritional and metabolic disease: Secondary | ICD-10-CM | POA: Insufficient documentation

## 2014-03-14 DIAGNOSIS — IMO0002 Reserved for concepts with insufficient information to code with codable children: Secondary | ICD-10-CM | POA: Insufficient documentation

## 2014-03-14 DIAGNOSIS — Z8619 Personal history of other infectious and parasitic diseases: Secondary | ICD-10-CM | POA: Insufficient documentation

## 2014-03-14 DIAGNOSIS — K219 Gastro-esophageal reflux disease without esophagitis: Secondary | ICD-10-CM | POA: Insufficient documentation

## 2014-03-14 DIAGNOSIS — Z862 Personal history of diseases of the blood and blood-forming organs and certain disorders involving the immune mechanism: Secondary | ICD-10-CM | POA: Insufficient documentation

## 2014-03-14 DIAGNOSIS — Z8739 Personal history of other diseases of the musculoskeletal system and connective tissue: Secondary | ICD-10-CM | POA: Insufficient documentation

## 2014-03-14 DIAGNOSIS — F17200 Nicotine dependence, unspecified, uncomplicated: Secondary | ICD-10-CM | POA: Insufficient documentation

## 2014-03-14 LAB — COMPREHENSIVE METABOLIC PANEL
ALT: 16 U/L (ref 0–53)
AST: 19 U/L (ref 0–37)
Albumin: 4 g/dL (ref 3.5–5.2)
Alkaline Phosphatase: 57 U/L (ref 39–117)
BUN: 8 mg/dL (ref 6–23)
CALCIUM: 9.3 mg/dL (ref 8.4–10.5)
CO2: 25 meq/L (ref 19–32)
CREATININE: 0.7 mg/dL (ref 0.50–1.35)
Chloride: 104 mEq/L (ref 96–112)
GLUCOSE: 88 mg/dL (ref 70–99)
Potassium: 4.2 mEq/L (ref 3.7–5.3)
Sodium: 142 mEq/L (ref 137–147)
TOTAL PROTEIN: 7.7 g/dL (ref 6.0–8.3)
Total Bilirubin: 0.4 mg/dL (ref 0.3–1.2)

## 2014-03-14 LAB — CBC WITH DIFFERENTIAL/PLATELET
Basophils Absolute: 0 10*3/uL (ref 0.0–0.1)
Basophils Relative: 0 % (ref 0–1)
EOS ABS: 0.2 10*3/uL (ref 0.0–0.7)
EOS PCT: 1 % (ref 0–5)
HEMATOCRIT: 45.9 % (ref 39.0–52.0)
Hemoglobin: 16.5 g/dL (ref 13.0–17.0)
LYMPHS ABS: 3.4 10*3/uL (ref 0.7–4.0)
LYMPHS PCT: 31 % (ref 12–46)
MCH: 32.7 pg (ref 26.0–34.0)
MCHC: 35.9 g/dL (ref 30.0–36.0)
MCV: 91.1 fL (ref 78.0–100.0)
MONO ABS: 0.7 10*3/uL (ref 0.1–1.0)
Monocytes Relative: 6 % (ref 3–12)
Neutro Abs: 6.7 10*3/uL (ref 1.7–7.7)
Neutrophils Relative %: 62 % (ref 43–77)
PLATELETS: 228 10*3/uL (ref 150–400)
RBC: 5.04 MIL/uL (ref 4.22–5.81)
RDW: 13.7 % (ref 11.5–15.5)
WBC: 10.9 10*3/uL — AB (ref 4.0–10.5)

## 2014-03-14 LAB — LIPASE, BLOOD: LIPASE: 28 U/L (ref 11–59)

## 2014-03-14 MED ORDER — GI COCKTAIL ~~LOC~~
30.0000 mL | Freq: Once | ORAL | Status: AC
  Administered 2014-03-14: 30 mL via ORAL
  Filled 2014-03-14: qty 30

## 2014-03-14 MED ORDER — OMEPRAZOLE 20 MG PO CPDR: 20.0000 mg | DELAYED_RELEASE_CAPSULE | Freq: Every day | ORAL | Status: DC

## 2014-03-14 MED ORDER — GI COCKTAIL ~~LOC~~: 30.0000 mL | Freq: Once | ORAL | Status: DC

## 2014-03-14 MED ORDER — OXYCODONE-ACETAMINOPHEN 5-325 MG PO TABS
1.0000 | ORAL_TABLET | Freq: Once | ORAL | Status: AC
  Administered 2014-03-14: 1 via ORAL
  Filled 2014-03-14: qty 1

## 2014-03-14 NOTE — ED Notes (Signed)
Pt A+ox4, ambulatory with steady gait, reports c/o epigastric pain x2 days after eating a big meal.  Pt reports fullness feeling in abdomen, reports burning sensation, worse with laying down.  Pt reports feeling as though he needs to vomit but is unable.  6/10 pain currently.  No active vomiting.  Denies v/d/c.  Denies fevers/chills.  Denies other complaints.  Reports able to tolerate eating/drinking without issue.  Skin PWD.  MAEI.

## 2014-03-16 NOTE — ED Provider Notes (Signed)
CSN: [id number]     Arrival date & time 03/14/14  1549 History   First MD Initiated Contact with Patient 03/14/14 1731     Chief Complaint  Patient presents with  . Abdominal Pain      HPI Patient presents to the emergency department complaining of epigastric discomfort over the past 2 days mainly with eating.  He states it radiates up into his anterior chest and feels like a burning discomfort.  Is worse when he lays down.  At times he feels as though vomiting would improve his symptoms.  No vomiting so far.  Denies diarrhea.  No shortness of breath.  No fevers or chills.  He states he used to be on acid reflux medicine but no longer takes it.   Past Medical History  Diagnosis Date  . Arthritis   . Hep C w/o coma, chronic   . Panhypopituitarism    Past Surgical History  Procedure Laterality Date  . No past surgeries    . Craniotomy N/A 04/10/2013    Procedure: CRANIOTOMY HYPOPHYSECTOMY TRANSNASAL APPROACH;  Surgeon: Hosie Spangle, MD;  Location: [facility];  Service: Neurosurgery;  Laterality: N/A;  Transphenoidal resection of pituitary tumor  . Transnasal approach N/A 04/10/2013    Procedure: TRANSNASAL APPROACH;  Surgeon: [name], MD;  Location: [facility];  Service: ENT;  Laterality: N/A;   No family history on file. History  Substance Use Topics  . Smoking status: Current Every Day Smoker -- 1.00 packs/day for 45 years    Types: Cigarettes  . Smokeless tobacco: Not on file  . Alcohol Use: Yes     Comment: quit x 4 yrs    Review of Systems  All other systems reviewed and are negative.     Allergies  Review of patient's allergies indicates no known allergies.  Home Medications   Prior to Admission medications   Medication Sig Start Date End Date Taking? Authorizing Daxter Paule  hydrocortisone (CORTEF) 10 MG tablet Take 5-10 mg by mouth 2 (two) times daily. 10mg  in the am and 5 mg in the pm   Yes Historical Billee Balcerzak, MD  levothyroxine (SYNTHROID) 75 MCG  tablet Take 1 tablet (75 mcg total) by mouth daily before breakfast. 04/12/13  Yes Hosie Spangle, MD  testosterone cypionate (DEPOTESTOTERONE CYPIONATE) 200 MG/ML injection Inject 200 mg into the muscle every 14 (fourteen) days. Due for injection 04/02/13 or 04/03/13   Yes Historical Bransyn Adami, MD          BP 144/80  Pulse 92  Temp(Src) 98.9 F (37.2 C) (Oral)  Resp 20  SpO2 100% Physical Exam  Nursing note and vitals reviewed. Constitutional: He is oriented to person, place, and time. He appears well-developed and well-nourished.  HENT:  Head: Normocephalic and atraumatic.  Eyes: EOM are normal.  Neck: Normal range of motion.  Cardiovascular: Normal rate, regular rhythm, normal heart sounds and intact distal pulses.   Pulmonary/Chest: Effort normal and breath sounds normal. No respiratory distress.  Abdominal: Soft. He exhibits no distension. There is no tenderness.  Musculoskeletal: Normal range of motion.  Neurological: He is alert and oriented to person, place, and time.  Skin: Skin is warm and dry.  Psychiatric: He has a normal mood and affect. Judgment normal.    ED Course  Procedures (including critical care time) Labs Review Labs Reviewed  CBC WITH DIFFERENTIAL - Abnormal; Notable for the following:    WBC 10.9 (*)    All other components within normal limits  COMPREHENSIVE METABOLIC PANEL  LIPASE, BLOOD    Imaging Review Dg Chest 2 View  03/14/2014   CLINICAL DATA:  Chest pain.  EXAM: CHEST  2 VIEW  COMPARISON:  April 29, 2012.  FINDINGS: The heart size and mediastinal contours are within normal limits. Both lungs are clear. No pneumothorax or pleural effusion is noted. The visualized skeletal structures are unremarkable.  IMPRESSION: No acute cardiopulmonary abnormality seen.   Electronically Signed   By: Sabino Dick M.D.   On: 03/14/2014 18:44     EKG Interpretation None      MDM   Final diagnoses:  Epigastric pain  Gastroesophageal reflux disease  without esophagitis    Suspect gastroesophageal reflux disease.  Discharge home in good condition.    Hoy Morn, MD 03/16/14 [phone]

## 2015-09-05 ENCOUNTER — Emergency Department (HOSPITAL_COMMUNITY): Payer: PPO

## 2015-09-05 ENCOUNTER — Encounter (HOSPITAL_COMMUNITY): Payer: Self-pay | Admitting: Emergency Medicine

## 2015-09-05 ENCOUNTER — Emergency Department (HOSPITAL_COMMUNITY)
Admission: EM | Admit: 2015-09-05 | Discharge: 2015-09-05 | Disposition: A | Discharge status: Home / Self Care | Payer: PPO | Attending: Emergency Medicine | Admitting: Emergency Medicine

## 2015-09-05 DIAGNOSIS — Z79899 Other long term (current) drug therapy: Secondary | ICD-10-CM | POA: Diagnosis not present

## 2015-09-05 DIAGNOSIS — E230 Hypopituitarism: Secondary | ICD-10-CM | POA: Diagnosis not present

## 2015-09-05 DIAGNOSIS — R1031 Right lower quadrant pain: Secondary | ICD-10-CM | POA: Insufficient documentation

## 2015-09-05 DIAGNOSIS — F17210 Nicotine dependence, cigarettes, uncomplicated: Secondary | ICD-10-CM | POA: Insufficient documentation

## 2015-09-05 DIAGNOSIS — Z7951 Long term (current) use of inhaled steroids: Secondary | ICD-10-CM | POA: Diagnosis not present

## 2015-09-05 DIAGNOSIS — R109 Unspecified abdominal pain: Secondary | ICD-10-CM

## 2015-09-05 DIAGNOSIS — R110 Nausea: Secondary | ICD-10-CM | POA: Diagnosis not present

## 2015-09-05 DIAGNOSIS — Z7952 Long term (current) use of systemic steroids: Secondary | ICD-10-CM | POA: Insufficient documentation

## 2015-09-05 DIAGNOSIS — R1011 Right upper quadrant pain: Secondary | ICD-10-CM | POA: Insufficient documentation

## 2015-09-05 DIAGNOSIS — Z8739 Personal history of other diseases of the musculoskeletal system and connective tissue: Secondary | ICD-10-CM | POA: Diagnosis not present

## 2015-09-05 DIAGNOSIS — Z8619 Personal history of other infectious and parasitic diseases: Secondary | ICD-10-CM | POA: Diagnosis not present

## 2015-09-05 LAB — COMPREHENSIVE METABOLIC PANEL
ALK PHOS: 58 U/L (ref 38–126)
ALT: 18 U/L (ref 17–63)
AST: 28 U/L (ref 15–41)
Albumin: 4.3 g/dL (ref 3.5–5.0)
Anion gap: 10 (ref 5–15)
BUN: 15 mg/dL (ref 6–20)
CALCIUM: 9.6 mg/dL (ref 8.9–10.3)
CHLORIDE: 105 mmol/L (ref 101–111)
CO2: 24 mmol/L (ref 22–32)
CREATININE: 0.88 mg/dL (ref 0.61–1.24)
GFR calc Af Amer: >60 mL/min (ref >60)
Glucose, Bld: 102 mg/dL — ABNORMAL HIGH (ref 65–99)
Potassium: 4.1 mmol/L (ref 3.5–5.1)
Sodium: 139 mmol/L (ref 135–145)
Total Bilirubin: 0.8 mg/dL (ref 0.3–1.2)
Total Protein: 7.9 g/dL (ref 6.5–8.1)

## 2015-09-05 LAB — DIFFERENTIAL
Basophils Absolute: 0 10*3/uL (ref 0.0–0.1)
Basophils Relative: 0 %
EOS ABS: 0.3 10*3/uL (ref 0.0–0.7)
EOS PCT: 4 %
LYMPHS ABS: 2.9 10*3/uL (ref 0.7–4.0)
Lymphocytes Relative: 32 %
MONOS PCT: 6 %
Monocytes Absolute: 0.5 10*3/uL (ref 0.1–1.0)
NEUTROS PCT: 58 %
Neutro Abs: 5.2 10*3/uL (ref 1.7–7.7)

## 2015-09-05 LAB — CBC
HCT: 42.2 % (ref 39.0–52.0)
Hemoglobin: 15 g/dL (ref 13.0–17.0)
MCH: 32.3 pg (ref 26.0–34.0)
MCHC: 35.5 g/dL (ref 30.0–36.0)
MCV: 90.8 fL (ref 78.0–100.0)
PLATELETS: 225 10*3/uL (ref 150–400)
RBC: 4.65 MIL/uL (ref 4.22–5.81)
RDW: 13.1 % (ref 11.5–15.5)
WBC: 8.9 10*3/uL (ref 4.0–10.5)

## 2015-09-05 LAB — URINALYSIS, ROUTINE W REFLEX MICROSCOPIC
BILIRUBIN URINE: NEGATIVE
Glucose, UA: NEGATIVE mg/dL
HGB URINE DIPSTICK: NEGATIVE
Ketones, ur: NEGATIVE mg/dL
Leukocytes, UA: NEGATIVE
Nitrite: NEGATIVE
Protein, ur: NEGATIVE mg/dL
SPECIFIC GRAVITY, URINE: 1.012 (ref 1.005–1.030)
pH: 7 (ref 5.0–8.0)

## 2015-09-05 LAB — LIPASE, BLOOD: LIPASE: 39 U/L (ref 11–51)

## 2015-09-05 MED ORDER — SODIUM CHLORIDE 0.9 % IV BOLUS (SEPSIS)
1000.0000 mL | Freq: Once | INTRAVENOUS | Status: AC
  Administered 2015-09-05: 1000 mL via INTRAVENOUS

## 2015-09-05 MED ORDER — TRAMADOL HCL 50 MG PO TABS: 50.0000 mg | ORAL_TABLET | Freq: Four times a day (QID) | ORAL | Status: DC | PRN

## 2015-09-05 MED ORDER — FENTANYL CITRATE (PF) 100 MCG/2ML IJ SOLN
50.0000 ug | Freq: Once | INTRAMUSCULAR | Status: AC
  Administered 2015-09-05: 50 ug via INTRAVENOUS
  Filled 2015-09-05: qty 2

## 2015-09-05 MED ORDER — ONDANSETRON 4 MG PO TBDP: 4.0000 mg | ORAL_TABLET | Freq: Three times a day (TID) | ORAL | Status: DC | PRN

## 2015-09-05 MED ORDER — ONDANSETRON 4 MG PO TBDP
4.0000 mg | ORAL_TABLET | Freq: Once | ORAL | Status: AC | PRN
  Administered 2015-09-05: 4 mg via ORAL
  Filled 2015-09-05: qty 1

## 2015-09-05 MED ORDER — IOHEXOL 300 MG/ML  SOLN
50.0000 mL | Freq: Once | INTRAMUSCULAR | Status: AC | PRN
  Administered 2015-09-05: 50 mL via ORAL

## 2015-09-05 MED ORDER — IOHEXOL 300 MG/ML  SOLN
100.0000 mL | Freq: Once | INTRAMUSCULAR | Status: AC | PRN
  Administered 2015-09-05: 100 mL via INTRAVENOUS

## 2015-09-05 NOTE — ED Notes (Signed)
Bed: [facility] Expected date:  Expected time:  Means of arrival:  Comments: Abdominal pain

## 2015-09-05 NOTE — Discharge Instructions (Signed)

## 2015-09-05 NOTE — ED Provider Notes (Signed)
TIME SEEN: 6:00 AM  CHIEF COMPLAINT: Abdominal pain, nausea  HPI: Pt is a 64 y.o. male with history of hepatitis C who presents emergency department with right-sided abdominal pain, nausea and one loose bowel movement that started just prior to arrival. Had similar symptoms after eating beef week ago. No vomiting. No bloody stool or melena. No dysuria, hematuria, testicular pain or swelling. No prior history of abdominal surgery. No aggravating or relieving factors.  ROS: See HPI Constitutional: no fever  Eyes: no drainage  ENT: no runny nose   Cardiovascular:  no chest pain  Resp: no SOB  GI: no vomiting GU: no dysuria Integumentary: no rash  Allergy: no hives  Musculoskeletal: no leg swelling  Neurological: no slurred speech ROS otherwise negative  PAST MEDICAL HISTORY/PAST SURGICAL HISTORY:  Past Medical History  Diagnosis Date  . Arthritis   . Hep C w/o coma, chronic ([place])   . Panhypopituitarism ([place])     MEDICATIONS:  Prior to Admission medications   Medication Sig Start Date End Date Taking? Authorizing Provider  albuterol (PROVENTIL HFA;VENTOLIN HFA) 108 (90 BASE) MCG/ACT inhaler Inhale 2 puffs into the lungs 2 (two) times daily.   Yes Historical Provider, MD  ANDRODERM 4 MG/24HR PT24 patch Apply 1 patch topically at bedtime. 08/24/15  Yes Historical Provider, MD  beclomethasone (QVAR) 80 MCG/ACT inhaler Inhale 2 puffs into the lungs 2 (two) times daily.   Yes Historical Provider, MD  clotrimazole-betamethasone (LOTRISONE) cream Apply 1 application topically 2 (two) times daily. 08/10/15  Yes Historical Provider, MD  hydrocortisone (CORTEF) 10 MG tablet Take 5-10 mg by mouth 2 (two) times daily. 10mg  in the am and 5 mg in the pm   Yes Historical Provider, MD  levothyroxine (SYNTHROID) 75 MCG tablet Take 1 tablet (75 mcg total) by mouth daily before breakfast. 04/12/13  Yes [name], MD  pregabalin (LYRICA) 150 MG capsule Take 150 mg by mouth 2 (two) times daily.   Yes  Historical Provider, MD  sildenafil (VIAGRA) 50 MG tablet Take 50-100 mg by mouth daily as needed for erectile dysfunction.   Yes Historical Provider, MD    ALLERGIES:  No Known Allergies  SOCIAL HISTORY:  Social History  Substance Use Topics  . Smoking status: Current Every Day Smoker -- 1.00 packs/day for 45 years    Types: Cigarettes  . Smokeless tobacco: Not on file  . Alcohol Use: Yes     Comment: quit x 4 yrs    FAMILY HISTORY: History reviewed. No pertinent family history.  EXAM: BP 160/87 mmHg  Pulse 54  Temp(Src) 97.8 F (36.6 C) (Oral)  Resp 18  SpO2 100% CONSTITUTIONAL: Alert and oriented and responds appropriately to questions. Well-appearing; well-nourished HEAD: Normocephalic EYES: Conjunctivae clear, PERRL ENT: normal nose; no rhinorrhea; moist mucous membranes; pharynx without lesions noted NECK: Supple, no meningismus, no LAD  CARD: RRR; S1 and S2 appreciated; no murmurs, no clicks, no rubs, no gallops RESP: Normal chest excursion without splinting or tachypnea; breath sounds clear and equal bilaterally; no wheezes, no rhonchi, no rales, no hypoxia or respiratory distress, speaking full sentences ABD/GI: Normal bowel sounds; non-distended; soft, tender in the right upper and right lower quadrant, no rebound, no guarding, no peritoneal signs, negative Murphy sign BACK:  The back appears normal and is non-tender to palpation, there is no CVA tenderness EXT: Normal ROM in all joints; non-tender to palpation; no edema; normal capillary refill; no cyanosis, no calf tenderness or swelling    SKIN: Normal color  for age and race; warm NEURO: Moves all extremities equally, sensation to light touch intact diffusely, cranial nerves II through XII intact PSYCH: The patient's mood and manner are appropriate. Grooming and personal hygiene are appropriate.  MEDICAL DECISION MAKING: Patient here with right abdominal pain. Reproducible on exam. Differential diagnosis includes  cholelithiasis, cholecystitis, pancreatitis, colitis, appendicitis. We'll obtain labs, urine, CT of his abdomen and pelvis. We'll give IV fluids, pain and nausea medicine.  ED PROGRESS: Patient's labs and urine are unremarkable. CT shows no acute abnormality. Appendix is normal. No abnormality seen of the gallbladder. He reports feeling better after one dose of pain medication and IV fluids. Drinking without difficulty. We'll discharge home with prescriptions for pain and nausea medicine to take as needed. Discussed return precautions. Discussed with patient that this may have been indigestion, acid reflux, constipation, the beginning of a viral illness but there is no life-threatening illness present and I feel he is safe to be discharged home. He is comfortable with this plan.     Iglesia [place], DO 09/05/15 [phone]

## 2015-09-05 NOTE — ED Notes (Signed)
Per EMS, pt. From home with complaint of left upper abdominal pain at 8/10 with nausea which started at 2am this morning. Pt. Also reported of having loose BM x1 at 3 am. Pt. Stated that he  Had same pain a wek ago after eating beef. A/ox3, no SOB.

## 2016-05-19 ENCOUNTER — Other Ambulatory Visit: Payer: Self-pay | Admitting: Internal Medicine

## 2016-05-19 DIAGNOSIS — D352 Benign neoplasm of pituitary gland: Secondary | ICD-10-CM

## 2016-06-01 ENCOUNTER — Ambulatory Visit (INDEPENDENT_AMBULATORY_CARE_PROVIDER_SITE_OTHER): Payer: Medicare Other | Admitting: Diagnostic Neuroimaging

## 2016-06-01 ENCOUNTER — Encounter (INDEPENDENT_AMBULATORY_CARE_PROVIDER_SITE_OTHER): Payer: Self-pay | Admitting: Diagnostic Neuroimaging

## 2016-06-01 ENCOUNTER — Ambulatory Visit
Admission: RE | Admit: 2016-06-01 | Discharge: 2016-06-01 | Disposition: A | Discharge status: Home / Self Care | Payer: Medicare Other | Source: Ambulatory Visit | Attending: Internal Medicine | Admitting: Internal Medicine

## 2016-06-01 DIAGNOSIS — D352 Benign neoplasm of pituitary gland: Secondary | ICD-10-CM

## 2016-06-01 DIAGNOSIS — Z0289 Encounter for other administrative examinations: Secondary | ICD-10-CM

## 2016-06-01 DIAGNOSIS — M79604 Pain in right leg: Secondary | ICD-10-CM | POA: Diagnosis not present

## 2016-06-01 MED ORDER — GADOBENATE DIMEGLUMINE 529 MG/ML IV SOLN
8.0000 mL | Freq: Once | INTRAVENOUS | Status: AC | PRN
  Administered 2016-06-01: 8 mL via INTRAVENOUS

## 2016-06-01 NOTE — Procedures (Signed)
   [facility]  NCS (NERVE CONDUCTION STUDY) WITH EMG (ELECTROMYOGRAPHY) REPORT   STUDY DATE: 06/01/16 PATIENT NAME: Rodney Ayala DOB: August 17, 1951 MRN: [id number]  ORDERING CLINICIAN: Roney Marion   TECHNOLOGIST: Laretta Alstrom  ELECTROMYOGRAPHER: Earlean Polka. Frady Taddeo, MD  CLINICAL INFORMATION: 65 year old male with right > left leg burning pain.   FINDINGS: NERVE CONDUCTION STUDY: Bilateral peroneal and tibial motor responses and F wave latencies are normal. Bilateral H reflex responses are normal. Bilateral peroneal sensory responses are normal.   NEEDLE ELECTROMYOGRAPHY: Needle examination of right lower extremity gluteus medius, iliopsoas, vastus medialis, tibialis anterior, gastrocnemius is normal.  Right L5-S1 paraspinal muscles demonstrate 2+ positive sharp waves and fibrillation potentials. Left L5-S1 paraspinal muscles are normal.   IMPRESSION:  Mildly abnormal study demonstrating: 1. Abnormal spontaneous activity in the right L5-S1 paraspinal muscles may reflect underlying nerve root irritation. 2. No evidence of underlying large fiber neuropathy. Remainder of nerve conduction study and needle EMG of the right lower extremity is normal.    INTERPRETING PHYSICIAN:  Penni Bombard, MD Certified in Neurology, Neurophysiology and Neuroimaging  [facility] [address], [place], [place] [address] [phone]

## 2016-09-22 ENCOUNTER — Ambulatory Visit (INDEPENDENT_AMBULATORY_CARE_PROVIDER_SITE_OTHER): Payer: Medicare Other

## 2016-09-22 ENCOUNTER — Encounter (HOSPITAL_COMMUNITY): Payer: Self-pay | Admitting: Family Medicine

## 2016-09-22 ENCOUNTER — Ambulatory Visit (HOSPITAL_COMMUNITY)
Admission: EM | Admit: 2016-09-22 | Discharge: 2016-09-22 | Disposition: A | Discharge status: Home / Self Care | Payer: Medicare Other | Attending: Family Medicine | Admitting: Family Medicine

## 2016-09-22 DIAGNOSIS — J208 Acute bronchitis due to other specified organisms: Secondary | ICD-10-CM

## 2016-09-22 MED ORDER — LEVOFLOXACIN 500 MG PO TABS: 500.0000 mg | ORAL_TABLET | Freq: Every day | ORAL | 0 refills | Status: DC

## 2016-09-22 NOTE — ED Provider Notes (Signed)
[facility]    CSN: [id number] Arrival date & time: 09/22/16  1137     History   Chief Complaint Chief Complaint  Patient presents with  . Cough    HPI Rodney Ayala is a 66 y.o. male.   The history is provided by the patient.  Cough  Cough characteristics:  Productive Sputum characteristics:  [place] Severity:  Moderate Onset quality:  Sudden Duration:  7 days Progression:  Unchanged Chronicity:  New Smoker: yes   Context: smoke exposure, upper respiratory infection and weather changes   Associated symptoms: rhinorrhea, shortness of breath, sinus congestion and wheezing   Associated symptoms: no fever     Past Medical History:  Diagnosis Date  . Arthritis   . Hep C w/o coma, chronic ([place])   . Panhypopituitarism ([place])     There are no active problems to display for this patient.   Past Surgical History:  Procedure Laterality Date  . CRANIOTOMY N/A 04/10/2013   Procedure: CRANIOTOMY HYPOPHYSECTOMY TRANSNASAL APPROACH;  Surgeon: Hosie Spangle, MD;  Location: [facility];  Service: Neurosurgery;  Laterality: N/A;  Transphenoidal resection of pituitary tumor  . NO PAST SURGERIES    . TRANSNASAL APPROACH N/A 04/10/2013   Procedure: TRANSNASAL APPROACH;  Surgeon: [name], MD;  Location: [facility];  Service: ENT;  Laterality: N/A;       Home Medications    Prior to Admission medications   Medication Sig Start Date End Date Taking? Authorizing Provider  albuterol (PROVENTIL HFA;VENTOLIN HFA) 108 (90 BASE) MCG/ACT inhaler Inhale 2 puffs into the lungs 2 (two) times daily.    Historical Provider, MD  ANDRODERM 4 MG/24HR PT24 patch Apply 1 patch topically at bedtime. 08/24/15   Historical Provider, MD  beclomethasone (QVAR) 80 MCG/ACT inhaler Inhale 2 puffs into the lungs 2 (two) times daily.    Historical Provider, MD  clotrimazole-betamethasone (LOTRISONE) cream Apply 1 application topically 2 (two) times daily. 08/10/15   Historical Provider, MD    hydrocortisone (CORTEF) 10 MG tablet Take 5-10 mg by mouth 2 (two) times daily. 10mg  in the am and 5 mg in the pm    Historical Provider, MD  levothyroxine (SYNTHROID) 75 MCG tablet Take 1 tablet (75 mcg total) by mouth daily before breakfast. 04/12/13   [name], MD  ondansetron (ZOFRAN ODT) 4 MG disintegrating tablet Take 1 tablet (4 mg total) by mouth every 8 (eight) hours as needed for nausea or vomiting. 09/05/15   Kristen N Ward, DO  pregabalin (LYRICA) 150 MG capsule Take 150 mg by mouth 2 (two) times daily.    Historical Provider, MD  sildenafil (VIAGRA) 50 MG tablet Take 50-100 mg by mouth daily as needed for erectile dysfunction.    Historical Provider, MD  traMADol (ULTRAM) 50 MG tablet Take 1 tablet (50 mg total) by mouth every 6 (six) hours as needed. 09/05/15   [place], DO    Family History History reviewed. No pertinent family history.  Social History Social History  Substance Use Topics  . Smoking status: Current Every Day Smoker    Packs/day: 1.00    Years: 45.00    Types: Cigarettes  . Smokeless tobacco: Never Used  . Alcohol use Yes     Comment: quit x 4 yrs     Allergies   Patient has no known allergies.   Review of Systems Review of Systems  Constitutional: Negative.  Negative for fever.  HENT: Positive for congestion and rhinorrhea.  Respiratory: Positive for cough, shortness of breath and wheezing.   Cardiovascular: Negative.   Neurological: Negative.   All other systems reviewed and are negative.    Physical Exam Triage Vital Signs ED Triage Vitals [09/22/16 1156]  Enc Vitals Group     BP 120/78     Pulse Rate 84     Resp 18     Temp 98.1 F (36.7 C)     Temp src      SpO2 98 %     Weight      Height      Head Circumference      Peak Flow      Pain Score      Pain Loc      Pain Edu?      Excl. in [place]?    No data found.   Updated Vital Signs BP 120/78   Pulse 84   Temp 98.1 F (36.7 C)   Resp 18   SpO2 98%    Visual Acuity Right Eye Distance:   Left Eye Distance:   Bilateral Distance:    Right Eye Near:   Left Eye Near:    Bilateral Near:     Physical Exam  Constitutional: He appears well-developed and well-nourished. No distress.  HENT:  Right Ear: External ear normal.  Left Ear: External ear normal.  Nose: Nose normal.  Mouth/Throat: Oropharynx is clear and moist.  Eyes: Conjunctivae are normal. Pupils are equal, round, and reactive to light.  Neck: Normal range of motion. Neck supple.  Cardiovascular: Normal rate and regular rhythm.   Pulmonary/Chest: Effort normal. He has wheezes. He has rales.  Abdominal: Soft. Bowel sounds are normal.  Skin: Skin is warm and dry.  Nursing note and vitals reviewed.    UC Treatments / Results  Labs (all labs ordered are listed, but only abnormal results are displayed) Labs Reviewed - No data to display  EKG  EKG Interpretation None       Radiology No results found. X-rays reviewed and report per radiologist.  Procedures Procedures (including critical care time)  Medications Ordered in UC Medications - No data to display   Initial Impression / Assessment and Plan / UC Course  I have reviewed the triage vital signs and the nursing notes.  Pertinent labs & imaging results that were available during my care of the patient were reviewed by me and considered in my medical decision making (see chart for details).  Clinical Course       Final Clinical Impressions(s) / UC Diagnoses   Final diagnoses:  None    New Prescriptions New Prescriptions   No medications on file     Billy Fischer, MD 10/06/16 1430

## 2016-09-22 NOTE — ED Notes (Signed)
Pt   Inquiring   About  X  Ray  Status      Order in  System       Pt  Is  In  Gown    Awaiting   X  Rays

## 2016-09-22 NOTE — ED Triage Notes (Signed)
Pt here for chest congestion and nasal congestion. sts coughing and chills. sts since Saturday.

## 2016-09-22 NOTE — Discharge Instructions (Signed)
Take all of medicine, drink lots of fluids, no more smoking, see your doctor if further problems  °

## 2016-11-29 ENCOUNTER — Other Ambulatory Visit: Payer: Self-pay | Admitting: Sports Medicine

## 2016-11-29 DIAGNOSIS — M545 Low back pain: Secondary | ICD-10-CM

## 2016-12-08 ENCOUNTER — Ambulatory Visit
Admission: RE | Admit: 2016-12-08 | Discharge: 2016-12-08 | Disposition: A | Discharge status: Home / Self Care | Payer: Medicare Other | Source: Ambulatory Visit | Attending: Sports Medicine | Admitting: Sports Medicine

## 2016-12-08 DIAGNOSIS — M545 Low back pain: Secondary | ICD-10-CM

## 2016-12-08 MED ORDER — IOPAMIDOL (ISOVUE-M 200) INJECTION 41%
15.0000 mL | Freq: Once | INTRAMUSCULAR | Status: AC
  Administered 2016-12-08: 15 mL via INTRATHECAL

## 2016-12-08 MED ORDER — DIAZEPAM 5 MG PO TABS
5.0000 mg | ORAL_TABLET | Freq: Once | ORAL | Status: AC
  Administered 2016-12-08: 5 mg via ORAL

## 2016-12-08 NOTE — Discharge Instructions (Signed)

## 2017-01-02 ENCOUNTER — Other Ambulatory Visit: Payer: Self-pay | Admitting: Neurosurgery

## 2017-01-18 NOTE — Pre-Procedure Instructions (Signed)
Rodney Ayala  01/18/2017      [id number] [address], [place] [address] Phone: [phone] Fax: [phone]    Your procedure is scheduled on Thursday, May 10.  Report to [facility] Admitting at 5:30 AM                Your surgery or procedure is scheduled for 7:30 AM   Call this number if you have problems the morning of surgery: [phone]               For any other questions, please call [phone], Monday - Friday 8 AM - 4 PM.   Remember:  Do not eat food or drink liquids after midnight Wednesday, May 9.  Take these medicines the morning of surgery with A SIP OF WATER :atorvastatin (LIPITOR), hydrocortisone (CORTEF), omeprazole (PRILOSEC).  May use Albuterol and bring it to the hospital with you.             1 Week prior to surgery STOP taking Aspirin  Aspirin Products (Goody Powder, Excedrin Migraine), Ibuprofen (Advil), Naproxen (Aleve), Viiamins and Herbal Products (ie Fish Oil). Special instructions:   [place]- Preparing For Surgery  Before surgery, you can play an important role. Because skin is not sterile, your skin needs to be as free of germs as possible. You can reduce the number of germs on your skin by washing with CHG (chlorahexidine gluconate) Soap before surgery.  CHG is an antiseptic cleaner which kills germs and bonds with the skin to continue killing germs even after washing.  Please do not use if you have an allergy to CHG or antibacterial soaps. If your skin becomes reddened/irritated stop using the CHG.  Do not shave (including legs and underarms) for at least 48 hours prior to first CHG shower. It is OK to shave your face.  Please follow these instructions carefully.   1. Shower the NIGHT BEFORE SURGERY and the MORNING OF SURGERY with CHG.   2. If you chose to wash your hair, wash your hair first as usual with your normal  shampoo.  3. After you shampoo, rinse your hair and body thoroughly to remove the shampoo.  4. Use CHG as you would any other liquid soap. You can apply CHG directly to the skin and wash gently with a scrungie or a clean washcloth.   5. Apply the CHG Soap to your body ONLY FROM THE NECK DOWN.  Do not use on open wounds or open sores. Avoid contact with your eyes, ears, mouth and genitals (private parts). Wash genitals (private parts) with your normal soap.  6. Wash thoroughly, paying special attention to the area where your surgery will be performed.  7. Thoroughly rinse your body with warm water from the neck down.  8. DO NOT shower/wash with your normal soap after using and rinsing off the CHG Soap.  9. Pat yourself dry with a CLEAN TOWEL.   10. Wear CLEAN PAJAMAS   11. Place CLEAN SHEETS on your bed the night of your first shower and DO NOT SLEEP WITH PETS.  Day of Surgery: Do not apply any deodorants/lotions,powders, or perfumes, . Please wear clean clothes to the hospital/surgery center.    Do not wear jewelry, make-up or nail polish.  Do not shave 48 hours prior to surgery.  Men may shave face and neck.  Do not bring valuables to  the hospital.  [facility] is not responsible for any belongings or valuables.  Contacts, dentures or bridgework may not be worn into surgery.  Leave your suitcase in the car.  After surgery it may be brought to your room.  For patients admitted to the hospital, discharge time will be determined by your treatment team.  Patients discharged the day of surgery will not be allowed to drive home.   Please read over the following fact sheets that you were given: Patient Instructions for Mupirocin Application, Coughing and Deep Breathing, Pain Booklet, Surgical Site Infections.

## 2017-01-19 ENCOUNTER — Inpatient Hospital Stay (HOSPITAL_COMMUNITY)
Admission: RE | Admit: 2017-01-19 | Discharge: 2017-01-19 | Disposition: A | Discharge status: Home / Self Care | Payer: Medicare Other | Source: Ambulatory Visit

## 2017-01-19 NOTE — Pre-Procedure Instructions (Addendum)
Rodney Ayala  01/19/2017      [id number] [address], [place] [address] Phone: [phone] Fax: [phone]    Your procedure is scheduled on Thursday, May 10.  Report to [facility] Admitting at 5:30 AM                Your surgery or procedure is scheduled for 7:30 AM   Call this number if you have problems the morning of surgery: [phone]               For any other questions, please call [phone], Monday - Friday 8 AM - 4 PM.   Remember:  Do not eat food or drink liquids after midnight Wednesday, May 9.  Take these medicines the morning of surgery with A SIP OF WATER : levothyroxine (synthroid), hydrocortisone (CORTEF), omeprazole (PRILOSEC).  May use Albuterol and bring it to the hospital with you.             1 Week prior to surgery STOP taking Aspirin  Aspirin Products (Goody Powder, Excedrin Migraine), Ibuprofen (Advil), Naproxen (Aleve), mobic, Viiamins and Herbal Products (ie Fish Oil). Special instructions:   [facility]- Preparing For Surgery  Before surgery, you can play an important role. Because skin is not sterile, your skin needs to be as free of germs as possible. You can reduce the number of germs on your skin by washing with CHG (chlorahexidine gluconate) Soap before surgery.  CHG is an antiseptic cleaner which kills germs and bonds with the skin to continue killing germs even after washing.  Please do not use if you have an allergy to CHG or antibacterial soaps. If your skin becomes reddened/irritated stop using the CHG.  Do not shave (including legs and underarms) for at least 48 hours prior to first CHG shower. It is OK to shave your face.  Please follow these instructions carefully.   1. Shower the NIGHT BEFORE SURGERY and the MORNING OF SURGERY with CHG.   2. If you chose to wash your hair, wash your hair first as usual with your normal  shampoo.  3. After you shampoo, rinse your hair and body thoroughly to remove the shampoo.  4. Use CHG as you would any other liquid soap. You can apply CHG directly to the skin and wash gently with a scrungie or a clean washcloth.   5. Apply the CHG Soap to your body ONLY FROM THE NECK DOWN.  Do not use on open wounds or open sores. Avoid contact with your eyes, ears, mouth and genitals (private parts). Wash genitals (private parts) with your normal soap.  6. Wash thoroughly, paying special attention to the area where your surgery will be performed.  7. Thoroughly rinse your body with warm water from the neck down.  8. DO NOT shower/wash with your normal soap after using and rinsing off the CHG Soap.  9. Pat yourself dry with a CLEAN TOWEL.   10. Wear CLEAN PAJAMAS   11. Place CLEAN SHEETS on your bed the night of your first shower and DO NOT SLEEP WITH PETS.  Day of Surgery: Do not apply any deodorants/lotions,powders, or perfumes, . Please wear clean clothes to the hospital/surgery center.    Do not wear jewelry, make-up or nail polish.  Do not shave 48 hours prior to surgery.  Men may shave face and neck.  Do not bring  valuables to the hospital.  [facility] is not responsible for any belongings or valuables.  Contacts, dentures or bridgework may not be worn into surgery.  Leave your suitcase in the car.  After surgery it may be brought to your room.  For patients admitted to the hospital, discharge time will be determined by your treatment team.  Patients discharged the day of surgery will not be allowed to drive home.   Please read over the following fact sheets that you were given: Patient Instructions for Mupirocin Application, Coughing and Deep Breathing, Pain Booklet, Surgical Site Infections.

## 2017-01-22 ENCOUNTER — Encounter (HOSPITAL_COMMUNITY)
Admission: RE | Admit: 2017-01-22 | Discharge: 2017-01-22 | Disposition: A | Discharge status: Home / Self Care | Payer: Medicare Other | Source: Ambulatory Visit | Attending: Neurosurgery | Admitting: Neurosurgery

## 2017-01-22 ENCOUNTER — Encounter (HOSPITAL_COMMUNITY): Payer: Self-pay

## 2017-01-22 DIAGNOSIS — M48061 Spinal stenosis, lumbar region without neurogenic claudication: Secondary | ICD-10-CM | POA: Diagnosis not present

## 2017-01-22 DIAGNOSIS — Z01812 Encounter for preprocedural laboratory examination: Secondary | ICD-10-CM | POA: Diagnosis not present

## 2017-01-22 DIAGNOSIS — I10 Essential (primary) hypertension: Secondary | ICD-10-CM | POA: Diagnosis not present

## 2017-01-22 DIAGNOSIS — Z0183 Encounter for blood typing: Secondary | ICD-10-CM | POA: Insufficient documentation

## 2017-01-22 DIAGNOSIS — R001 Bradycardia, unspecified: Secondary | ICD-10-CM | POA: Insufficient documentation

## 2017-01-22 DIAGNOSIS — Z01818 Encounter for other preprocedural examination: Secondary | ICD-10-CM | POA: Diagnosis present

## 2017-01-22 HISTORY — DX: Gastro-esophageal reflux disease without esophagitis: K21.9

## 2017-01-22 HISTORY — DX: Hypothyroidism, unspecified: E03.9

## 2017-01-22 LAB — CBC
HCT: 43.6 % (ref 39.0–52.0)
Hemoglobin: 15.1 g/dL (ref 13.0–17.0)
MCH: 31.1 pg (ref 26.0–34.0)
MCHC: 34.6 g/dL (ref 30.0–36.0)
MCV: 89.9 fL (ref 78.0–100.0)
PLATELETS: 211 10*3/uL (ref 150–400)
RBC: 4.85 MIL/uL (ref 4.22–5.81)
RDW: 13.7 % (ref 11.5–15.5)
WBC: 11.1 10*3/uL — ABNORMAL HIGH (ref 4.0–10.5)

## 2017-01-22 LAB — BASIC METABOLIC PANEL
Anion gap: 7 (ref 5–15)
BUN: 14 mg/dL (ref 6–20)
CALCIUM: 9.3 mg/dL (ref 8.9–10.3)
CO2: 27 mmol/L (ref 22–32)
CREATININE: 0.88 mg/dL (ref 0.61–1.24)
Chloride: 107 mmol/L (ref 101–111)
GFR calc Af Amer: >60 mL/min (ref >60)
GFR calc non Af Amer: >60 mL/min (ref >60)
GLUCOSE: 106 mg/dL — AB (ref 65–99)
Potassium: 4 mmol/L (ref 3.5–5.1)
Sodium: 141 mmol/L (ref 135–145)

## 2017-01-22 LAB — SURGICAL PCR SCREEN
MRSA, PCR: NEGATIVE
Staphylococcus aureus: NEGATIVE

## 2017-01-22 MED ORDER — CHLORHEXIDINE GLUCONATE CLOTH 2 % EX PADS: 6.0000 | MEDICATED_PAD | Freq: Once | CUTANEOUS | Status: DC

## 2017-01-25 ENCOUNTER — Encounter (HOSPITAL_COMMUNITY): Payer: Self-pay | Admitting: *Deleted

## 2017-01-25 ENCOUNTER — Observation Stay (HOSPITAL_COMMUNITY)
Admission: RE | Admit: 2017-01-25 | Discharge: 2017-01-26 | Disposition: A | Discharge status: Home / Self Care | Payer: Medicare Other | Source: Ambulatory Visit | Attending: Neurosurgery | Admitting: Neurosurgery

## 2017-01-25 ENCOUNTER — Encounter (HOSPITAL_COMMUNITY)
Admission: RE | Disposition: A | Discharge status: Home / Self Care | Payer: Self-pay | Source: Ambulatory Visit | Attending: Neurosurgery

## 2017-01-25 ENCOUNTER — Ambulatory Visit (HOSPITAL_COMMUNITY): Payer: Medicare Other | Admitting: Certified Registered Nurse Anesthetist

## 2017-01-25 ENCOUNTER — Ambulatory Visit (HOSPITAL_COMMUNITY): Payer: Medicare Other

## 2017-01-25 DIAGNOSIS — Z7952 Long term (current) use of systemic steroids: Secondary | ICD-10-CM | POA: Diagnosis not present

## 2017-01-25 DIAGNOSIS — K219 Gastro-esophageal reflux disease without esophagitis: Secondary | ICD-10-CM | POA: Diagnosis not present

## 2017-01-25 DIAGNOSIS — Z79899 Other long term (current) drug therapy: Secondary | ICD-10-CM | POA: Diagnosis not present

## 2017-01-25 DIAGNOSIS — E039 Hypothyroidism, unspecified: Secondary | ICD-10-CM | POA: Insufficient documentation

## 2017-01-25 DIAGNOSIS — F17210 Nicotine dependence, cigarettes, uncomplicated: Secondary | ICD-10-CM | POA: Diagnosis not present

## 2017-01-25 DIAGNOSIS — M47816 Spondylosis without myelopathy or radiculopathy, lumbar region: Secondary | ICD-10-CM | POA: Insufficient documentation

## 2017-01-25 DIAGNOSIS — M48062 Spinal stenosis, lumbar region with neurogenic claudication: Secondary | ICD-10-CM | POA: Diagnosis present

## 2017-01-25 DIAGNOSIS — Z7982 Long term (current) use of aspirin: Secondary | ICD-10-CM | POA: Diagnosis not present

## 2017-01-25 DIAGNOSIS — Z419 Encounter for procedure for purposes other than remedying health state, unspecified: Secondary | ICD-10-CM

## 2017-01-25 DIAGNOSIS — E230 Hypopituitarism: Secondary | ICD-10-CM | POA: Diagnosis not present

## 2017-01-25 DIAGNOSIS — Z791 Long term (current) use of non-steroidal anti-inflammatories (NSAID): Secondary | ICD-10-CM | POA: Diagnosis not present

## 2017-01-25 DIAGNOSIS — M5136 Other intervertebral disc degeneration, lumbar region: Secondary | ICD-10-CM | POA: Diagnosis not present

## 2017-01-25 DIAGNOSIS — M48061 Spinal stenosis, lumbar region without neurogenic claudication: Secondary | ICD-10-CM | POA: Diagnosis present

## 2017-01-25 HISTORY — PX: LUMBAR LAMINECTOMY/DECOMPRESSION MICRODISCECTOMY: SHX5026

## 2017-01-25 SURGERY — LUMBAR LAMINECTOMY/DECOMPRESSION MICRODISCECTOMY 1 LEVEL
Anesthesia: General | Site: Back

## 2017-01-25 MED ORDER — ONDANSETRON HCL 4 MG/2ML IJ SOLN
INTRAMUSCULAR | Status: AC
  Filled 2017-01-25: qty 2

## 2017-01-25 MED ORDER — LEVOTHYROXINE SODIUM 88 MCG PO TABS
88.0000 ug | ORAL_TABLET | Freq: Every day | ORAL | Status: DC
  Administered 2017-01-26: 88 ug via ORAL
  Filled 2017-01-25: qty 1

## 2017-01-25 MED ORDER — 0.9 % SODIUM CHLORIDE (POUR BTL) OPTIME
TOPICAL | Status: DC | PRN
  Administered 2017-01-25: 1000 mL

## 2017-01-25 MED ORDER — PANTOPRAZOLE SODIUM 40 MG PO TBEC
40.0000 mg | DELAYED_RELEASE_TABLET | Freq: Every day | ORAL | Status: DC
  Administered 2017-01-26: 40 mg via ORAL
  Filled 2017-01-25: qty 1

## 2017-01-25 MED ORDER — DOCUSATE SODIUM 100 MG PO CAPS
100.0000 mg | ORAL_CAPSULE | Freq: Every day | ORAL | Status: DC
  Administered 2017-01-26: 100 mg via ORAL
  Filled 2017-01-25: qty 1

## 2017-01-25 MED ORDER — PROPOFOL 10 MG/ML IV BOLUS
INTRAVENOUS | Status: AC
  Filled 2017-01-25: qty 20

## 2017-01-25 MED ORDER — ONDANSETRON HCL 4 MG/2ML IJ SOLN: 4.0000 mg | Freq: Four times a day (QID) | INTRAMUSCULAR | Status: DC | PRN

## 2017-01-25 MED ORDER — KETOROLAC TROMETHAMINE 15 MG/ML IJ SOLN
INTRAMUSCULAR | Status: AC
  Filled 2017-01-25: qty 1

## 2017-01-25 MED ORDER — ACETAMINOPHEN 10 MG/ML IV SOLN
INTRAVENOUS | Status: DC | PRN
  Administered 2017-01-25: 1000 mg via INTRAVENOUS

## 2017-01-25 MED ORDER — FENTANYL CITRATE (PF) 250 MCG/5ML IJ SOLN
INTRAMUSCULAR | Status: AC
  Filled 2017-01-25: qty 5

## 2017-01-25 MED ORDER — PROPOFOL 10 MG/ML IV BOLUS
INTRAVENOUS | Status: DC | PRN
  Administered 2017-01-25: 150 mg via INTRAVENOUS

## 2017-01-25 MED ORDER — HYDROXYZINE HCL 25 MG PO TABS: 50.0000 mg | ORAL_TABLET | ORAL | Status: DC | PRN

## 2017-01-25 MED ORDER — KCL IN DEXTROSE-NACL 20-5-0.45 MEQ/L-%-% IV SOLN: INTRAVENOUS | Status: DC

## 2017-01-25 MED ORDER — THROMBIN 5000 UNITS EX SOLR
OROMUCOSAL | Status: DC | PRN
  Administered 2017-01-25: 5 mL via TOPICAL

## 2017-01-25 MED ORDER — MENTHOL 3 MG MT LOZG: 1.0000 | LOZENGE | OROMUCOSAL | Status: DC | PRN

## 2017-01-25 MED ORDER — TESTOSTERONE 2 MG/24HR TD PT24: 2.0000 mg | MEDICATED_PATCH | TRANSDERMAL | Status: DC

## 2017-01-25 MED ORDER — BUPIVACAINE HCL (PF) 0.5 % IJ SOLN
INTRAMUSCULAR | Status: DC | PRN
  Administered 2017-01-25: 15 mL

## 2017-01-25 MED ORDER — THROMBIN 5000 UNITS EX SOLR
CUTANEOUS | Status: DC | PRN
  Administered 2017-01-25 (×2): 5000 [IU] via TOPICAL

## 2017-01-25 MED ORDER — SUGAMMADEX SODIUM 200 MG/2ML IV SOLN
INTRAVENOUS | Status: DC | PRN
  Administered 2017-01-25: 200 mg via INTRAVENOUS

## 2017-01-25 MED ORDER — LIDOCAINE-EPINEPHRINE 1 %-1:100000 IJ SOLN
INTRAMUSCULAR | Status: AC
  Filled 2017-01-25: qty 1

## 2017-01-25 MED ORDER — FLEET ENEMA 7-19 GM/118ML RE ENEM: 1.0000 | ENEMA | Freq: Once | RECTAL | Status: DC | PRN

## 2017-01-25 MED ORDER — FENTANYL CITRATE (PF) 100 MCG/2ML IJ SOLN
INTRAMUSCULAR | Status: DC | PRN
  Administered 2017-01-25 (×2): 75 ug via INTRAVENOUS
  Administered 2017-01-25: 50 ug via INTRAVENOUS

## 2017-01-25 MED ORDER — DEXAMETHASONE SODIUM PHOSPHATE 10 MG/ML IJ SOLN
INTRAMUSCULAR | Status: DC | PRN
  Administered 2017-01-25: 10 mg via INTRAVENOUS

## 2017-01-25 MED ORDER — MIDAZOLAM HCL 5 MG/5ML IJ SOLN
INTRAMUSCULAR | Status: DC | PRN
  Administered 2017-01-25: 2 mg via INTRAVENOUS

## 2017-01-25 MED ORDER — ATORVASTATIN CALCIUM 10 MG PO TABS
10.0000 mg | ORAL_TABLET | Freq: Every day | ORAL | Status: DC
  Administered 2017-01-26: 10 mg via ORAL
  Filled 2017-01-25: qty 1

## 2017-01-25 MED ORDER — SODIUM CHLORIDE 0.9% FLUSH: 3.0000 mL | INTRAVENOUS | Status: DC | PRN

## 2017-01-25 MED ORDER — TADALAFIL 5 MG PO TABS: 5.0000 mg | ORAL_TABLET | Freq: Every day | ORAL | Status: DC

## 2017-01-25 MED ORDER — MAGNESIUM HYDROXIDE 400 MG/5ML PO SUSP: 30.0000 mL | Freq: Every day | ORAL | Status: DC | PRN

## 2017-01-25 MED ORDER — CYCLOBENZAPRINE HCL 10 MG PO TABS
ORAL_TABLET | ORAL | Status: AC
  Filled 2017-01-25: qty 1

## 2017-01-25 MED ORDER — BISACODYL 10 MG RE SUPP: 10.0000 mg | Freq: Every day | RECTAL | Status: DC | PRN

## 2017-01-25 MED ORDER — CEFAZOLIN SODIUM-DEXTROSE 2-4 GM/100ML-% IV SOLN
2.0000 g | INTRAVENOUS | Status: AC
  Administered 2017-01-25: 2 g via INTRAVENOUS
  Filled 2017-01-25: qty 100

## 2017-01-25 MED ORDER — HYDROMORPHONE HCL 1 MG/ML IJ SOLN
0.2500 mg | INTRAMUSCULAR | Status: DC | PRN
  Administered 2017-01-25: 0.5 mg via INTRAVENOUS

## 2017-01-25 MED ORDER — PHENOL 1.4 % MT LIQD: 1.0000 | OROMUCOSAL | Status: DC | PRN

## 2017-01-25 MED ORDER — SODIUM CHLORIDE 0.9% FLUSH
3.0000 mL | Freq: Two times a day (BID) | INTRAVENOUS | Status: DC
  Administered 2017-01-25 (×2): 3 mL via INTRAVENOUS

## 2017-01-25 MED ORDER — PROMETHAZINE HCL 25 MG/ML IJ SOLN: 6.2500 mg | INTRAMUSCULAR | Status: DC | PRN

## 2017-01-25 MED ORDER — HEMOSTATIC AGENTS (NO CHARGE) OPTIME
TOPICAL | Status: DC | PRN
  Administered 2017-01-25: 1 via TOPICAL

## 2017-01-25 MED ORDER — DEXAMETHASONE SODIUM PHOSPHATE 10 MG/ML IJ SOLN
INTRAMUSCULAR | Status: AC
  Filled 2017-01-25: qty 1

## 2017-01-25 MED ORDER — TESTOSTERONE 4 MG/24HR TD PT24: 1.0000 | MEDICATED_PATCH | TRANSDERMAL | Status: DC

## 2017-01-25 MED ORDER — CYCLOBENZAPRINE HCL 5 MG PO TABS
5.0000 mg | ORAL_TABLET | Freq: Three times a day (TID) | ORAL | Status: DC | PRN
  Administered 2017-01-25 (×2): 10 mg via ORAL
  Filled 2017-01-25: qty 2

## 2017-01-25 MED ORDER — ALBUTEROL SULFATE (2.5 MG/3ML) 0.083% IN NEBU: 2.5000 mg | INHALATION_SOLUTION | Freq: Four times a day (QID) | RESPIRATORY_TRACT | Status: DC | PRN

## 2017-01-25 MED ORDER — CYANOCOBALAMIN 500 MCG PO TABS
500.0000 ug | ORAL_TABLET | Freq: Every day | ORAL | Status: DC
  Administered 2017-01-26: 500 ug via ORAL
  Filled 2017-01-25 (×2): qty 1

## 2017-01-25 MED ORDER — MIDAZOLAM HCL 2 MG/2ML IJ SOLN
INTRAMUSCULAR | Status: AC
  Filled 2017-01-25: qty 2

## 2017-01-25 MED ORDER — ROCURONIUM BROMIDE 100 MG/10ML IV SOLN
INTRAVENOUS | Status: DC | PRN
  Administered 2017-01-25: 50 mg via INTRAVENOUS
  Administered 2017-01-25: 10 mg via INTRAVENOUS

## 2017-01-25 MED ORDER — LIDOCAINE-EPINEPHRINE 1 %-1:100000 IJ SOLN
INTRAMUSCULAR | Status: DC | PRN
  Administered 2017-01-25: 15 mL

## 2017-01-25 MED ORDER — HYDROMORPHONE HCL 1 MG/ML IJ SOLN
INTRAMUSCULAR | Status: AC
  Filled 2017-01-25: qty 1

## 2017-01-25 MED ORDER — KETOROLAC TROMETHAMINE 15 MG/ML IJ SOLN
15.0000 mg | Freq: Once | INTRAMUSCULAR | Status: AC
  Administered 2017-01-25: 15 mg via INTRAVENOUS

## 2017-01-25 MED ORDER — EPHEDRINE 5 MG/ML INJ
INTRAVENOUS | Status: AC
  Filled 2017-01-25: qty 10

## 2017-01-25 MED ORDER — ACETAMINOPHEN 325 MG PO TABS: 650.0000 mg | ORAL_TABLET | ORAL | Status: DC | PRN

## 2017-01-25 MED ORDER — PHENYLEPHRINE 40 MCG/ML (10ML) SYRINGE FOR IV PUSH (FOR BLOOD PRESSURE SUPPORT)
PREFILLED_SYRINGE | INTRAVENOUS | Status: AC
  Filled 2017-01-25: qty 10

## 2017-01-25 MED ORDER — KETOROLAC TROMETHAMINE 15 MG/ML IJ SOLN
15.0000 mg | Freq: Four times a day (QID) | INTRAMUSCULAR | Status: DC
  Administered 2017-01-25 – 2017-01-26 (×3): 15 mg via INTRAVENOUS
  Filled 2017-01-25 (×3): qty 1

## 2017-01-25 MED ORDER — ALUM & MAG HYDROXIDE-SIMETH 200-200-20 MG/5ML PO SUSP: 30.0000 mL | Freq: Four times a day (QID) | ORAL | Status: DC | PRN

## 2017-01-25 MED ORDER — ONDANSETRON HCL 4 MG PO TABS: 4.0000 mg | ORAL_TABLET | Freq: Four times a day (QID) | ORAL | Status: DC | PRN

## 2017-01-25 MED ORDER — THROMBIN 5000 UNITS EX SOLR
CUTANEOUS | Status: AC
  Filled 2017-01-25: qty 5000

## 2017-01-25 MED ORDER — BUPIVACAINE HCL (PF) 0.5 % IJ SOLN
INTRAMUSCULAR | Status: AC
  Filled 2017-01-25: qty 30

## 2017-01-25 MED ORDER — THROMBIN 5000 UNITS EX SOLR
CUTANEOUS | Status: AC
  Filled 2017-01-25: qty 10000

## 2017-01-25 MED ORDER — ONDANSETRON HCL 4 MG/2ML IJ SOLN
INTRAMUSCULAR | Status: DC | PRN
  Administered 2017-01-25: 4 mg via INTRAVENOUS

## 2017-01-25 MED ORDER — EPHEDRINE SULFATE 50 MG/ML IJ SOLN
INTRAMUSCULAR | Status: DC | PRN
  Administered 2017-01-25 (×2): 5 mg via INTRAVENOUS

## 2017-01-25 MED ORDER — HYDROCODONE-ACETAMINOPHEN 5-325 MG PO TABS
1.0000 | ORAL_TABLET | ORAL | Status: DC | PRN
  Administered 2017-01-25: 2 via ORAL
  Administered 2017-01-25: 1 via ORAL
  Administered 2017-01-26 (×2): 2 via ORAL
  Filled 2017-01-25: qty 1
  Filled 2017-01-25 (×3): qty 2

## 2017-01-25 MED ORDER — ACETAMINOPHEN 10 MG/ML IV SOLN
INTRAVENOUS | Status: AC
  Filled 2017-01-25: qty 100

## 2017-01-25 MED ORDER — MORPHINE SULFATE (PF) 4 MG/ML IV SOLN: 4.0000 mg | INTRAVENOUS | Status: DC | PRN

## 2017-01-25 MED ORDER — SODIUM CHLORIDE 0.9 % IR SOLN
Status: DC | PRN
  Administered 2017-01-25: 500 mL

## 2017-01-25 MED ORDER — HYDROXYZINE HCL 50 MG/ML IM SOLN: 50.0000 mg | INTRAMUSCULAR | Status: DC | PRN

## 2017-01-25 MED ORDER — LIDOCAINE HCL (CARDIAC) 20 MG/ML IV SOLN
INTRAVENOUS | Status: DC | PRN
  Administered 2017-01-25: 100 mg via INTRAVENOUS

## 2017-01-25 MED ORDER — HYDROCORTISONE 5 MG PO TABS
5.0000 mg | ORAL_TABLET | Freq: Every day | ORAL | Status: DC
  Administered 2017-01-26: 5 mg via ORAL
  Filled 2017-01-25: qty 1

## 2017-01-25 MED ORDER — ACETAMINOPHEN 650 MG RE SUPP: 650.0000 mg | RECTAL | Status: DC | PRN

## 2017-01-25 MED ORDER — LACTATED RINGERS IV SOLN
INTRAVENOUS | Status: DC | PRN
  Administered 2017-01-25 (×2): via INTRAVENOUS

## 2017-01-25 SURGICAL SUPPLY — 60 items
BAG DECANTER FOR FLEXI CONT (MISCELLANEOUS) ×2 IMPLANT
BENZOIN TINCTURE PRP APPL 2/3 (GAUZE/BANDAGES/DRESSINGS) IMPLANT
BLADE CLIPPER SURG (BLADE) IMPLANT
BUR ACORN 6.0 ACORN (BURR) IMPLANT
BUR ACRON 5.0MM COATED (BURR) ×2 IMPLANT
BUR MATCHSTICK NEURO 3.0 LAGG (BURR) ×2 IMPLANT
CANISTER SUCT 3000ML PPV (MISCELLANEOUS) ×2 IMPLANT
CARTRIDGE OIL MAESTRO DRILL (MISCELLANEOUS) ×1 IMPLANT
DERMABOND ADVANCED (GAUZE/BANDAGES/DRESSINGS) ×1
DERMABOND ADVANCED .7 DNX12 (GAUZE/BANDAGES/DRESSINGS) ×1 IMPLANT
DIFFUSER DRILL AIR PNEUMATIC (MISCELLANEOUS) ×2 IMPLANT
DRAPE LAPAROTOMY 100X72X124 (DRAPES) ×2 IMPLANT
DRAPE MICROSCOPE LEICA (MISCELLANEOUS) ×2 IMPLANT
DRAPE POUCH INSTRU U-SHP 10X18 (DRAPES) ×2 IMPLANT
ELECT REM PT RETURN 9FT ADLT (ELECTROSURGICAL) ×2
ELECTRODE REM PT RTRN 9FT ADLT (ELECTROSURGICAL) ×1 IMPLANT
GAUZE SPONGE 4X4 12PLY STRL (GAUZE/BANDAGES/DRESSINGS) IMPLANT
GAUZE SPONGE 4X4 12PLY STRL LF (GAUZE/BANDAGES/DRESSINGS) ×2 IMPLANT
GAUZE SPONGE 4X4 16PLY XRAY LF (GAUZE/BANDAGES/DRESSINGS) IMPLANT
GLOVE BIO SURGEON STRL SZ8 (GLOVE) ×2 IMPLANT
GLOVE BIOGEL PI IND STRL 7.0 (GLOVE) ×1 IMPLANT
GLOVE BIOGEL PI IND STRL 8 (GLOVE) ×1 IMPLANT
GLOVE BIOGEL PI IND STRL 8.5 (GLOVE) ×1 IMPLANT
GLOVE BIOGEL PI INDICATOR 7.0 (GLOVE) ×1
GLOVE BIOGEL PI INDICATOR 8 (GLOVE) ×1
GLOVE BIOGEL PI INDICATOR 8.5 (GLOVE) ×1
GLOVE ECLIPSE 7.5 STRL STRAW (GLOVE) ×2 IMPLANT
GLOVE EXAM NITRILE LRG STRL (GLOVE) IMPLANT
GLOVE EXAM NITRILE XL STR (GLOVE) IMPLANT
GLOVE EXAM NITRILE XS STR PU (GLOVE) IMPLANT
GOWN STRL REUS W/ TWL LRG LVL3 (GOWN DISPOSABLE) ×1 IMPLANT
GOWN STRL REUS W/ TWL XL LVL3 (GOWN DISPOSABLE) ×1 IMPLANT
GOWN STRL REUS W/TWL 2XL LVL3 (GOWN DISPOSABLE) ×2 IMPLANT
GOWN STRL REUS W/TWL LRG LVL3 (GOWN DISPOSABLE) ×1
GOWN STRL REUS W/TWL XL LVL3 (GOWN DISPOSABLE) ×1
HEMOSTAT POWDER KIT SURGIFOAM (HEMOSTASIS) ×2 IMPLANT
KIT BASIN OR (CUSTOM PROCEDURE TRAY) ×2 IMPLANT
KIT ROOM TURNOVER OR (KITS) ×2 IMPLANT
NEEDLE HYPO 18GX1.5 BLUNT FILL (NEEDLE) IMPLANT
NEEDLE SPNL 18GX3.5 QUINCKE PK (NEEDLE) ×2 IMPLANT
NEEDLE SPNL 22GX3.5 QUINCKE BK (NEEDLE) ×2 IMPLANT
NS IRRIG 1000ML POUR BTL (IV SOLUTION) ×2 IMPLANT
OIL CARTRIDGE MAESTRO DRILL (MISCELLANEOUS) ×2
PACK LAMINECTOMY NEURO (CUSTOM PROCEDURE TRAY) ×2 IMPLANT
PAD ARMBOARD 7.5X6 YLW CONV (MISCELLANEOUS) ×6 IMPLANT
PATTIES SURGICAL .5 X1 (DISPOSABLE) ×2 IMPLANT
RUBBERBAND STERILE (MISCELLANEOUS) ×4 IMPLANT
SPONGE LAP 4X18 X RAY DECT (DISPOSABLE) IMPLANT
SPONGE SURGIFOAM ABS GEL SZ50 (HEMOSTASIS) ×2 IMPLANT
STRIP CLOSURE SKIN 1/2X4 (GAUZE/BANDAGES/DRESSINGS) IMPLANT
SUT PROLENE 6 0 BV (SUTURE) IMPLANT
SUT VIC AB 1 CT1 18XBRD ANBCTR (SUTURE) ×1 IMPLANT
SUT VIC AB 1 CT1 8-18 (SUTURE) ×1
SUT VIC AB 2-0 CP2 18 (SUTURE) ×2 IMPLANT
SUT VIC AB 3-0 SH 8-18 (SUTURE) IMPLANT
SYR 5ML LL (SYRINGE) IMPLANT
TAPE CLOTH SURG 4X10 WHT LF (GAUZE/BANDAGES/DRESSINGS) ×2 IMPLANT
TOWEL GREEN STERILE (TOWEL DISPOSABLE) ×2 IMPLANT
TOWEL GREEN STERILE FF (TOWEL DISPOSABLE) ×2 IMPLANT
WATER STERILE IRR 1000ML POUR (IV SOLUTION) ×2 IMPLANT

## 2017-01-25 NOTE — Anesthesia Preprocedure Evaluation (Addendum)
Anesthesia Evaluation  Patient identified by MRN, date of birth, ID band Patient awake    Airway Mallampati: II  TM Distance: >3 FB Neck ROM: Full    Dental   Pulmonary Current Smoker,    breath sounds clear to auscultation       Cardiovascular  Rhythm:Regular Rate:Normal     Neuro/Psych    GI/Hepatic GERD  ,(+) Hepatitis -  Endo/Other  Hypothyroidism   Renal/GU      Musculoskeletal   Abdominal   Peds  Hematology   Anesthesia Other Findings   Reproductive/Obstetrics                            Anesthesia Physical Anesthesia Plan  ASA: III  Anesthesia Plan: General   Post-op Pain Management:    Induction: Intravenous  Airway Management Planned: Oral ETT  Additional Equipment:   Intra-op Plan:   Post-operative Plan: Extubation in OR  Informed Consent: I have reviewed the patients History and Physical, chart, labs and discussed the procedure including the risks, benefits and alternatives for the proposed anesthesia with the patient or authorized representative who has indicated his/her understanding and acceptance.     Plan Discussed with: CRNA  Anesthesia Plan Comments:         Anesthesia Quick Evaluation

## 2017-01-25 NOTE — Op Note (Signed)
01/25/2017  9:55 AM  PATIENT:  Rodney Ayala  66 y.o. 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  ANESTHESIA:   general  EBL:  Total I/O In: 1400 [I.V.:1400] Out: 100 [Blood:100]  BLOOD ADMINISTERED:none  COUNT:  Correct per nursing staff  DICTATION: Patient was brought to the operating room placed under general endotracheal anesthesia. Patient was turned to a prone position the lumbar region was prepped with Betadine soap and solution and draped in a sterile fashion. The midline was infiltrated with local anesthetic with epinephrine. A localizing x-ray was taken and then a midline incision was made carried down thru the subcutaneous tissue, bipolar cautery and electrocautery were used to maintain hemostasis. Dissection was carried down to the lumbar fascia which was incised bilaterally and the paraspinal muscles were dissected from the spinous process and lamina in a subperiosteal fashion. Another localizing x-ray was taken and the L3 and L4 levels were identified. Laminectomy was begun with the high-speed drill and Kerrison punches. The thickened ligamentum flavum was carefully removed. Dissection was carried laterally, performing bilateral medial facetectomies to decompress the lateral stenosis, taking care to  leave the facet complexes intact. Foraminotomies were performed bilaterally, so as to decompress the stenotic compression of the exiting L3 and L4 nerve roots bilaterally. Once the decompression was completed hemostasis was established with the use of Gelfoam with thrombin and Surgifoam. Bone edges were waxed as needed. Once hemostasis was confirmed, we proceeded with closure. Paraspinal muscles, deep fascia, and Scarpa's fascia were closed in separate layers with interrupted 1 undyed Vicryl sutures. The subcutaneous and subcuticular were closed with interrupted inverted 2-0 and 3-0 undyed Vicryl sutures. Skin edges were approximated with Dermabond. A dressing of sterile gauze and Hypafix was applied.   PLAN OF CARE: Admit for overnight observation  PATIENT DISPOSITION:  PACU - hemodynamically stable.   Delay start of Pharmacological VTE agent (>24hrs) due to surgical blood loss or risk of bleeding:  yes

## 2017-01-25 NOTE — Progress Notes (Signed)
Vitals:   01/25/17 1110 01/25/17 1227 01/25/17 1445 01/25/17 1708  BP: (!) 141/74 127/80 132/61 (!) 151/83  Pulse: 61 (!) 56 (!) 56 (!) 57  Resp: 18 16 16 16   Temp: 97.7 F (36.5 C) 98.2 F (36.8 C) 98.6 F (37 C) 97.8 F (36.6 C)  TempSrc: Oral     SpO2: 99% 97% 94% 97%  Weight:      Height:        Patient resting in bed comfortably. Has been up and walking in the halls with the staff. Dressing clean and dry. Voiding well. Neurogenic claudication much improved following surgery with decreased stinging and numbness.  Plan: Encouraged to ambulate. Continue to progress through postoperative recovery.  Hosie Spangle, MD 01/25/2017, 5:31 PM

## 2017-01-25 NOTE — H&P (Signed)
Subjective: Patient is a 66 y.o. left handed black male who is admitted for treatment of L3-4 lumbar stenosis. Patient's been having increasing difficulties with neurogenic claudication for the past year. He's undergone evaluation and treatment with several physicians. Lumbar myelogram post-Monogram CT scan as well as MRI scan were done. These studies show multilevel lumbar spondylosis and degenerative disc disease but most notably moderate to marked multifactorial lumbar stenosis at the L3-4 level. Patient admitted now for an L3 and L4 decompressive lumbar laminectomy.    Past Medical History:  Diagnosis Date  . Arthritis   . GERD (gastroesophageal reflux disease)   . Hep C w/o coma, chronic ([place])   . Hypothyroidism   . 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)     Past Surgical History:  Procedure Laterality Date  . CRANIOTOMY N/A 04/10/2013   Procedure: CRANIOTOMY HYPOPHYSECTOMY TRANSNASAL APPROACH;  Surgeon: Hosie Spangle, MD;  Location: [facility];  Service: Neurosurgery;  Laterality: N/A;  Transphenoidal resection of pituitary tumor  . NO PAST SURGERIES    . TRANSNASAL APPROACH N/A 04/10/2013   Procedure: TRANSNASAL APPROACH;  Surgeon: [name], MD;  Location: [facility];  Service: ENT;  Laterality: N/A;    Prescriptions Prior to Admission  Medication Sig Dispense Refill Last Dose  . ANDRODERM 4 MG/24HR PT24 patch Apply 1 patch topically daily. Uses a 4 mg and 2 mg patch together to equal 6 mg daily  3 01/25/2017 at Unknown time  . aspirin EC 81 MG tablet Take 81 mg by mouth daily.   Past Week at Unknown time  . atorvastatin (LIPITOR) 10 MG tablet Take 10 mg by mouth daily.    01/25/2017 at 0410  . CIALIS 5 MG tablet Take 5 mg by mouth daily.    Past Week at Unknown time  . docusate sodium (COLACE) 100 MG capsule Take 100 mg by mouth daily.   Past Week at Unknown time  . hydrocortisone (CORTEF) 10 MG tablet Take 5 mg by mouth daily.    01/25/2017 at 0410  . levothyroxine (SYNTHROID, LEVOTHROID)  88 MCG tablet Take 88 mcg by mouth daily before breakfast.   01/25/2017 at 0410  . meloxicam (MOBIC) 15 MG tablet Take 15 mg by mouth daily.    Past Week at Unknown time  . omeprazole (PRILOSEC) 20 MG capsule Take 20 mg by mouth daily.   Past Week at Unknown time  . Testosterone (ANDRODERM) 2 MG/24HR PT24 Place 2 mg onto the skin daily. Uses a 2 mg and 4 mg patch together daily to equal 6 mg   01/25/2017 at Unknown time  . vitamin B-12 (CYANOCOBALAMIN) 500 MCG tablet Take 500 mcg by mouth daily.   Past Week at Unknown time  . albuterol (PROVENTIL HFA;VENTOLIN HFA) 108 (90 BASE) MCG/ACT inhaler Inhale 2 puffs into the lungs every 6 (six) hours as needed for wheezing or shortness of breath.    More than a month at Unknown time  . levothyroxine (SYNTHROID) 75 MCG tablet Take 1 tablet (75 mcg total) by mouth daily before breakfast. (Patient not taking: Reported on 01/16/2017) 30 tablet 0 Not Taking at Unknown time   Allergies  Allergen Reactions  . No Known Allergies     Social History  Substance Use Topics  . Smoking status: Current Every Day Smoker    Packs/day: 1.00    Years: 45.00    Types: Cigarettes  . Smokeless tobacco: Never Used  . Alcohol use Yes     Comment: 15 yrs  History reviewed. No pertinent family history.   Review of Systems A comprehensive review of systems was negative.  Objective: Vital signs in last 24 hours: Temp:  [98 F (36.7 C)] 98 F (36.7 C) (05/10 0557) Pulse Rate:  [62] 62 (05/10 0557) Resp:  [20] 20 (05/10 0557) BP: (126)/(75) 126/75 (05/10 0557) SpO2:  [97 %] 97 % (05/10 0557) Weight:  [76.7 kg (169 lb)] 76.7 kg (169 lb) (05/10 0622)  EXAM: Patient well-developed well-nourished male in no acute distress. Lungs are clear to auscultation , the patient has symmetrical respiratory excursion. Heart has a regular rate and rhythm normal S1 and S2 no murmur.   Abdomen is soft nontender nondistended bowel sounds are present. Extremity examination shows no  clubbing cyanosis or edema. Motor examination shows 5 over 5 strength in the lower extremities including the iliopsoas quadriceps dorsiflexor extensor hallicus  longus and plantar flexor bilaterally. Sensation is intact to pinprick in the distal lower extremities. Reflexes are symmetrical bilaterally. No pathologic reflexes are present. Patient has a normal gait and stance.   Data Review:CBC    Component Value Date/Time   WBC 11.1 (H) 01/22/2017 0835   RBC 4.85 01/22/2017 0835   HGB 15.1 01/22/2017 0835   HCT 43.6 01/22/2017 0835   PLT 211 01/22/2017 0835   MCV 89.9 01/22/2017 0835   MCH 31.1 01/22/2017 0835   MCHC 34.6 01/22/2017 0835   RDW 13.7 01/22/2017 0835   LYMPHSABS 2.9 09/05/2015 0610   MONOABS 0.5 09/05/2015 0610   EOSABS 0.3 09/05/2015 0610   BASOSABS 0.0 09/05/2015 0610                          BMET    Component Value Date/Time   NA 141 01/22/2017 0835   K 4.0 01/22/2017 0835   CL 107 01/22/2017 0835   CO2 27 01/22/2017 0835   GLUCOSE 106 (H) 01/22/2017 0835   BUN 14 01/22/2017 0835   CREATININE 0.88 01/22/2017 0835   CREATININE 0.69 02/09/2011 1355   CALCIUM 9.3 01/22/2017 0835   GFRNONAA >60 01/22/2017 0835   GFRAA >60 01/22/2017 0835     Assessment/Plan: Patient with progressive neurogenic claudication who has multifactorial lumbar stenosis at the L3-4 level, who is admitted for an L3 and L4 decompressive lumbar laminectomy.  I've discussed with the patient the nature of his condition, the nature the surgical procedure, the typical length of surgery, hospital stay, and overall recuperation. We discussed limitations postoperatively. I discussed risks of surgery including risks of infection, bleeding, possibly need for transfusion, the risk of nerve root dysfunction with pain, weakness, numbness, or paresthesias, or risk of dural tear and CSF leakage and possible need for further surgery, and the risk of anesthetic complications including myocardial infarction,  stroke, pneumonia, and death. Understanding all this the patient does wish to proceed with surgery and is admitted for such.    Hosie Spangle, MD 01/25/2017 6:43 AM

## 2017-01-25 NOTE — Transfer of Care (Signed)
Immediate Anesthesia Transfer of Care Note  Patient: Rodney Ayala  Procedure(s) Performed: Procedure(s): Lumbar three-lumbar four  Lumbar Laminectomy (N/A)  Patient Location: PACU  Anesthesia Type:General  Level of Consciousness: awake and alert   Airway & Oxygen Therapy: Patient Spontanous Breathing and Patient connected to nasal cannula oxygen  Post-op Assessment: Report given to RN, Post -op Vital signs reviewed and stable and Patient moving all extremities X 4  Post vital signs: Reviewed and stable  Last Vitals:  Vitals:   01/25/17 0557 01/25/17 1006  BP: 126/75   Pulse: 62   Resp: 20   Temp: 36.7 C (P) 36.6 C    Last Pain:  Vitals:   01/25/17 0622  PainSc: 4       Patients Stated Pain Goal: 3 (41/42/39 5320)  Complications: No apparent anesthesia complications

## 2017-01-25 NOTE — Anesthesia Procedure Notes (Signed)
Procedure Name: Intubation Date/Time: 01/25/2017 7:36 AM Performed by: Rejeana Brock L Pre-anesthesia Checklist: Patient identified, Emergency Drugs available, Suction available and Patient being monitored Patient Re-evaluated:Patient Re-evaluated prior to inductionOxygen Delivery Method: Circle System Utilized Preoxygenation: Pre-oxygenation with 100% oxygen Intubation Type: IV induction Ventilation: Mask ventilation without difficulty and Oral airway inserted - appropriate to patient size Laryngoscope Size: Mac and 4 Grade View: Grade I Tube type: Oral Tube size: 7.5 mm Number of attempts: 1 Airway Equipment and Method: Stylet and Oral airway Placement Confirmation: ETT inserted through vocal cords under direct vision,  positive ETCO2 and breath sounds checked- equal and bilateral Secured at: 22 cm Tube secured with: Tape Dental Injury: Teeth and Oropharynx as per pre-operative assessment

## 2017-01-25 NOTE — Anesthesia Postprocedure Evaluation (Addendum)
Anesthesia Post Note  Patient: Rodney Ayala  Procedure(s) Performed: Procedure(s) (LRB): Lumbar three-lumbar four  Lumbar Laminectomy (N/A)  Patient location during evaluation: PACU Anesthesia Type: General Level of consciousness: awake and alert Pain management: pain level controlled Vital Signs Assessment: post-procedure vital signs reviewed and stable Respiratory status: spontaneous breathing, nonlabored ventilation, respiratory function stable and patient connected to nasal cannula oxygen Cardiovascular status: blood pressure returned to baseline and stable Postop Assessment: no signs of nausea or vomiting Anesthetic complications: no       Last Vitals:  Vitals:   01/25/17 1023 01/25/17 1048  BP:  (!) 144/80  Pulse: 67 65  Resp: 15 18  Temp:      Last Pain:  Vitals:   01/25/17 1048  PainSc: 4     LLE Motor Response: Purposeful movement;Responds to commands (01/25/17 1048) LLE Sensation: Full sensation (01/25/17 1048) RLE Motor Response: Purposeful movement;Responds to commands (01/25/17 1048) RLE Sensation: Full sensation (01/25/17 1048)      [name],[name]

## 2017-01-26 ENCOUNTER — Encounter (HOSPITAL_COMMUNITY): Payer: Self-pay | Admitting: Neurosurgery

## 2017-01-26 DIAGNOSIS — M48062 Spinal stenosis, lumbar region with neurogenic claudication: Secondary | ICD-10-CM | POA: Diagnosis not present

## 2017-01-26 MED ORDER — CYCLOBENZAPRINE HCL 10 MG PO TABS: 10.0000 mg | ORAL_TABLET | Freq: Three times a day (TID) | ORAL | Status: DC | PRN

## 2017-01-26 MED ORDER — HYDROCODONE-ACETAMINOPHEN 5-325 MG PO TABS: 1.0000 | ORAL_TABLET | ORAL | 0 refills | Status: DC | PRN

## 2017-01-26 NOTE — Discharge Summary (Signed)
Physician Discharge Summary  Patient ID: Rodney Ayala MRN: [id number] DOB/AGE: 1951-04-12 66 y.o.  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 Postoperatively done well, with excellent relief of his neurogenic claudication. He is up and ambulating actively in the halls. His incision is healing nicely. There is no ecchymosis, swelling, or drainage. He is voiding well. He is asking to be discharged home. He is given him instructions regarding wound care and activities following discharge. He is scheduled to follow-up with me in the office in 3 weeks.  Discharge Exam: Blood pressure 128/76, pulse 61, temperature 97.8 F (36.6 C), temperature source Oral, resp. rate 18, height 5\' 8"  (1.727 m), weight 169 lb (76.7 kg), SpO2 100 %.  Disposition: Home  Discharge Instructions    Discharge wound care:    Complete by:  As directed    Leave the wound open to air. Shower daily with the wound uncovered. Water and soapy water should run over the incision area. Do not wash directly on the incision for 2 weeks. Remove the glue after 2 weeks.   Driving Restrictions    Complete by:  As directed    No driving for 2 weeks. May ride in the car locally now. May begin to drive locally in 2 weeks.   Other Restrictions    Complete by:  As directed    Walk gradually increasing distances out in the fresh air at least twice a day. Walking additional 6 times inside the house, gradually increasing distances, daily. No bending, lifting, or twisting. Perform activities  between shoulder and waist height (that is at counter height when standing or table height when sitting).     Allergies as of 01/26/2017      Reactions   No Known Allergies       Medication List    TAKE these medications   albuterol 108 (90 Base) MCG/ACT inhaler Commonly known as:  PROVENTIL HFA;VENTOLIN HFA Inhale 2 puffs into the lungs every 6 (six) hours as needed for wheezing or shortness of breath.   ANDRODERM 2 MG/24HR Pt24 Generic drug:  Testosterone Place 2 mg onto the skin daily. Uses a 2 mg and 4 mg patch together daily to equal 6 mg   ANDRODERM 4 MG/24HR Pt24 patch Generic drug:  testosterone Apply 1 patch topically daily. Uses a 4 mg and 2 mg patch together to equal 6 mg daily   aspirin EC 81 MG tablet Take 81 mg by mouth daily.   atorvastatin 10 MG tablet Commonly known as:  LIPITOR Take 10 mg by mouth daily.   CIALIS 5 MG tablet Generic drug:  tadalafil Take 5 mg by mouth daily.   docusate sodium 100 MG capsule Commonly known as:  COLACE Take 100 mg by mouth daily.   HYDROcodone-acetaminophen 5-325 MG tablet Commonly known as:  NORCO/VICODIN Take 1-2 tablets by mouth every 4 (four) hours as needed for moderate pain.   hydrocortisone 10 MG tablet Commonly known as:  CORTEF Take 5 mg by mouth daily.   levothyroxine 88 MCG tablet Commonly known as:  SYNTHROID, LEVOTHROID Take 88 mcg by mouth daily before breakfast. What changed:  Another medication with  the same name was removed. Continue taking this medication, and follow the directions you see here.   meloxicam 15 MG tablet Commonly known as:  MOBIC Take 15 mg by mouth daily.   omeprazole 20 MG capsule Commonly known as:  PRILOSEC Take 20 mg by mouth daily.   vitamin B-12 500 MCG tablet Commonly known as:  CYANOCOBALAMIN Take 500 mcg by mouth daily.        Signed: Hosie Spangle, MD 01/26/2017, 8:53 AM

## 2017-01-26 NOTE — Discharge Instructions (Signed)

## 2017-01-26 NOTE — Progress Notes (Signed)
Patient alert and oriented, mae's well, voiding adequate amount of urine, swallowing without difficulty, no c/o pain at time of discharge. Patient discharged home with family. Script and discharged instructions given to patient. Patient and family stated understanding of instructions given. Patient has an appointment with Dr. Nudelman 

## 2017-02-16 NOTE — Addendum Note (Signed)
Addendum  created 02/16/17 1452 by Jaydence Arnesen, MD   Sign clinical note    

## 2017-04-06 ENCOUNTER — Emergency Department (HOSPITAL_COMMUNITY)
Admission: EM | Admit: 2017-04-06 | Discharge: 2017-04-06 | Disposition: A | Discharge status: Home / Self Care | Payer: Medicare Other | Attending: Emergency Medicine | Admitting: Emergency Medicine

## 2017-04-06 ENCOUNTER — Encounter (HOSPITAL_COMMUNITY): Payer: Self-pay | Admitting: Emergency Medicine

## 2017-04-06 DIAGNOSIS — E039 Hypothyroidism, unspecified: Secondary | ICD-10-CM | POA: Insufficient documentation

## 2017-04-06 DIAGNOSIS — M545 Low back pain, unspecified: Secondary | ICD-10-CM

## 2017-04-06 DIAGNOSIS — Z79899 Other long term (current) drug therapy: Secondary | ICD-10-CM | POA: Diagnosis not present

## 2017-04-06 DIAGNOSIS — T148XXA Other injury of unspecified body region, initial encounter: Secondary | ICD-10-CM

## 2017-04-06 DIAGNOSIS — Z8739 Personal history of other diseases of the musculoskeletal system and connective tissue: Secondary | ICD-10-CM | POA: Insufficient documentation

## 2017-04-06 DIAGNOSIS — F17210 Nicotine dependence, cigarettes, uncomplicated: Secondary | ICD-10-CM | POA: Diagnosis not present

## 2017-04-06 DIAGNOSIS — Z7982 Long term (current) use of aspirin: Secondary | ICD-10-CM | POA: Diagnosis not present

## 2017-04-06 MED ORDER — METHOCARBAMOL 500 MG PO TABS
500.0000 mg | ORAL_TABLET | Freq: Once | ORAL | Status: AC
  Administered 2017-04-06: 500 mg via ORAL
  Filled 2017-04-06: qty 1

## 2017-04-06 MED ORDER — METHOCARBAMOL 500 MG PO TABS: 500.0000 mg | ORAL_TABLET | Freq: Two times a day (BID) | ORAL | 0 refills | Status: AC

## 2017-04-06 NOTE — ED Notes (Signed)
Pt with hx of L4-L5 laminectomy per Dr. Sherwood Gambler 5/18, c/o sudden right hip/thigh pain, onset 4 days ago. Has taken "muscle relaxers and Aleve"  Without improvement. Called [facility] office yesterday but did not get a call-back. Called back to office last pm and reportedly got into an argument with "the doctor".

## 2017-04-06 NOTE — Discharge Instructions (Signed)
Please read and follow all provided instructions.  Your diagnoses today include:  1. Acute right-sided low back pain without sciatica   2. Muscle strain     Tests performed today include: Vital signs - see below for your results today  Medications prescribed:   Take any prescribed medications only as directed.  Home care instructions:  Follow any educational materials contained in this packet Please rest, use ice or heat on your back for the next several days Do not lift, push, pull anything more than 10 pounds for the next week  Follow-up instructions: Please follow-up with your primary care provider in the next 1 week for further evaluation of your symptoms.   Return instructions:  SEEK IMMEDIATE MEDICAL ATTENTION IF YOU HAVE: New numbness, tingling, weakness, or problem with the use of your arms or legs Severe back pain not relieved with medications Loss control of your bowels or bladder Increasing pain in any areas of the body (such as chest or abdominal pain) Shortness of breath, dizziness, or fainting.  Worsening nausea (feeling sick to your stomach), vomiting, fever, or sweats Any other emergent concerns regarding your health   Additional Information:  Your vital signs today were: BP (!) 159/91    Pulse 60    Temp (!) 97.5 F (36.4 C) (Oral)    Resp 12    SpO2 100%  If your blood pressure (BP) was elevated above 135/85 this visit, please have this repeated by your doctor within one month. --------------

## 2017-04-06 NOTE — ED Provider Notes (Signed)
[facility] DEPT Provider Note   CSN: [id number] Arrival date & time: 04/06/17  1638  By signing my name below, I, Rodney Ayala, attest that this documentation has been prepared under the direction and in the presence of [name], PA-C. Electronically Signed: [name], Scribe. 04/06/2017. 10:25 AM.  History   Chief Complaint Chief Complaint  Patient presents with  . Back Pain   The history is provided by the patient. No language interpreter was used.    HPI Comments: Rodney Ayala is a 66 y.o. male with PMHx including lumbar stenosis who presents to the Emergency Department complaining of persistent, waxing and waning, right-sided lower back pain and stiffness beginning four days ago. The pain radiates into the mid-thigh on the right. He has a h/o back pain and had a lumbar laminectomy/decompression microdiscectomy last May. He states that his back pain was well-controlled after the surgery until a few days ago. He has tried meloxicam and Aleve without relief. No recent falls or trauma to his lower back, but he helped a friend cut down a large tree one week ago and believes this could have caused his current back pain. No recent over-exertion or heavy lifting otherwise. He is typically active and has been trying to maintain his baseline activity level. Patient denies numbness/tingling, focal weakness, urinary/bowel incontinence, left-sided back pain, or any other associated symptoms.  Past Medical History:  Diagnosis Date  . Arthritis   . GERD (gastroesophageal reflux disease)   . Hep C w/o coma, chronic ([name])   . Hypothyroidism   . 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 Lumbar stenosis 01/25/2017    Past Surgical History:  Procedure Laterality Date  . CRANIOTOMY N/A 04/10/2013   Procedure: CRANIOTOMY HYPOPHYSECTOMY TRANSNASAL APPROACH;  Surgeon: Hosie Spangle, MD;  Location: [facility] NEURO ORS;  Service: Neurosurgery;   Laterality: N/A;  Transphenoidal resection of pituitary tumor  . LUMBAR LAMINECTOMY/DECOMPRESSION MICRODISCECTOMY N/A 01/25/2017   Procedure: Lumbar three-lumbar four  Lumbar Laminectomy;  Surgeon: [name], MD;  Location: [facility] [facility];  Service: Neurosurgery;  Laterality: N/A;  . NO PAST SURGERIES    . TRANSNASAL APPROACH N/A 04/10/2013   Procedure: TRANSNASAL APPROACH;  Surgeon: [name], MD;  Location: [facility] NEUR[facility] ORS;  Service: ENT;  Laterality: N/A;       Home Medications    Prior to Admission medications   Medication Sig Start Date End Date Taking? Authorizing Provider  albuterol (PROVENTIL HFA;VENTOLIN HFA) 108 (90 BASE) MCG/ACT inhaler Inhale 2 puffs into the lungs every 6 (six) hours as needed for wheezing or shortness of breath.     [provider]  ANDRODERM 4 MG/24HR PT24 patch Apply 1 patch topically daily. Uses a 4 mg and 2 mg patch together to equal 6 mg daily 08/24/15   [provider]  aspirin EC 81 MG tablet Take 81 mg by mouth daily.    [provider]  atorvastatin (LIPITOR) 10 MG tablet Take 10 mg by mouth daily.  08/28/16   [provider]  CIALIS 5 MG tablet Take 5 mg by mouth daily.  08/28/16   [provider]  docusate sodium (COLACE) 100 MG capsule Take 100 mg by mouth daily.    [provider]  HYDROcodone-acetaminophen (NORCO/VICODIN) 5-325 MG tablet Take 1-2 tablets by mouth every 4 (four) hours as needed for moderate pain. 01/26/17   [name], MD  hydrocortisone (CORTEF) 10 MG tablet Take 5 mg  by mouth daily.     [provider]  levothyroxine (SYNTHROID, LEVOTHROID) 88 MCG tablet Take 88 mcg by mouth daily before breakfast.    [provider]  meloxicam (MOBIC) 15 MG tablet Take 15 mg by mouth daily.  08/31/16   [provider]  omeprazole (PRILOSEC) 20 MG capsule Take 20 mg by mouth daily.    [provider]  Testosterone (ANDRODERM) 2 MG/24HR PT24 Place 2 mg onto  the skin daily. Uses a 2 mg and 4 mg patch together daily to equal 6 mg    [provider]  vitamin B-12 (CYANOCOBALAMIN) 500 MCG tablet Take 500 mcg by mouth daily.    [provider]    Family History No family history on file.  Social History Social History  Substance Use Topics  . Smoking status: Current Every Day Smoker    Packs/day: 1.00    Years: 45.00    Types: Cigarettes  . Smokeless tobacco: Never Used  . Alcohol use Yes     Comment: 15 yrs     Allergies   No known allergies   Review of Systems Review of Systems  Gastrointestinal:       Negative for bowel incontinence.  Genitourinary:       Negative for urinary incontinence.  Musculoskeletal: Positive for back pain and myalgias.  Neurological: Negative for weakness and numbness.   Physical Exam Updated Vital Signs BP (!) 159/91   Pulse 60   Temp (!) 97.5 F (36.4 C) (Oral)   Resp 12   SpO2 100%   Physical Exam  Constitutional: He is oriented to person, place, and time. Vital signs are normal. He appears well-developed and well-nourished. No distress.  HENT:  Head: Normocephalic and atraumatic.  Right Ear: Hearing normal.  Left Ear: Hearing normal.  Eyes: Pupils are equal, round, and reactive to light. Conjunctivae and EOM are normal.  Neck: Neck supple. No tracheal deviation present.  Cardiovascular: Normal rate and regular rhythm.   Pulmonary/Chest: Effort normal. No respiratory distress.  Musculoskeletal: Normal range of motion. He exhibits tenderness.  Right lower lumbar musculature TTP. No midline tenderness. No palpable or visible deformities.  Neurological: He is alert and oriented to person, place, and time.  Skin: Skin is warm and dry.  Psychiatric: He has a normal mood and affect. His speech is normal and behavior is normal. Thought content normal.  Nursing note and vitals reviewed.  ED Treatments / Results  Labs (all labs ordered are listed, but only abnormal results  are displayed) Labs Reviewed - No data to display  EKG  EKG Interpretation None       Radiology No results found.  Procedures Procedures (including critical care time)  DIAGNOSTIC STUDIES: Oxygen Saturation is 100% on RA, normal by my interpretation.    COORDINATION OF CARE: 10:24 AM Discussed treatment plan with pt at bedside and pt agreed to plan.  Medications Ordered in ED Medications - No data to display   Initial Impression / Assessment and Plan / ED Course  I have reviewed the triage vital signs and the nursing notes.  Pertinent labs & imaging results that were available during my care of the patient were reviewed by me and considered in my medical decision making (see chart for details).        {I have reviewed the relevant previous healthcare records.  {I obtained HPI from historian.   ED Course:  Assessment: Patient is a 66 y.o. male with a hx of  lumbar surgery who presents to the ED with back pain. Likely musculoskeletal. No neurological deficits appreciated. Patient is ambulatory. No warning symptoms of back pain including: fecal incontinence, urinary retention or overflow incontinence, night sweats, waking from sleep with back pain, unexplained fevers or weight loss, h/o cancer, IVDU, recent trauma. No concern for cauda equina, epidural abscess, or other serious cause of back pain. Conservative measures such as rest, ice/heat and pain medicine indicated with PCP follow-up if no improvement with conservative management.  Disposition/Plan:  DC Home Additional Verbal discharge instructions given and discussed with patient.  Pt Instructed to f/u with PCP in the next week for evaluation and treatment of symptoms. Return precautions given Pt acknowledges and agrees with plan  Supervising Physician Drenda Freeze, MD  Final Clinical Impressions(s) / ED Diagnoses   Final diagnoses:  Acute right-sided low back pain without sciatica  Muscle strain    New  Prescriptions New Prescriptions   No medications on file   I personally performed the services described in this documentation, which was scribed in my presence. The recorded information has been reviewed and is accurate.    [name], PA-C 04/06/17 1029    Drenda Freeze, MD 04/07/17 [phone]

## 2017-04-06 NOTE — ED Triage Notes (Signed)
Pt reports back surgery two months ago, has been slowly increasing his activity, woke up Monday with pain and stiffness in right lower lumbar region. Pt ambulatory to triage.

## 2017-05-07 ENCOUNTER — Encounter: Payer: Self-pay | Admitting: Gastroenterology

## 2017-06-08 ENCOUNTER — Other Ambulatory Visit: Payer: Self-pay | Admitting: Internal Medicine

## 2017-06-08 DIAGNOSIS — M542 Cervicalgia: Secondary | ICD-10-CM

## 2017-06-08 DIAGNOSIS — D352 Benign neoplasm of pituitary gland: Secondary | ICD-10-CM

## 2017-06-26 ENCOUNTER — Ambulatory Visit (AMBULATORY_SURGERY_CENTER): Payer: Self-pay | Admitting: *Deleted

## 2017-06-26 VITALS — Ht 67.0 in | Wt 158.8 lb

## 2017-06-26 DIAGNOSIS — Z1211 Encounter for screening for malignant neoplasm of colon: Secondary | ICD-10-CM

## 2017-06-26 MED ORDER — NA SULFATE-K SULFATE-MG SULF 17.5-3.13-1.6 GM/177ML PO SOLN: 1.0000 | Freq: Once | ORAL | 0 refills | Status: AC

## 2017-06-26 NOTE — Progress Notes (Signed)
No egg or soy allergy known to patient  No issues with past sedation with any surgeries  or procedures, no intubation problems  No diet pills per patient No home 02 use per patient  No blood thinners per patient  Pt denies issues with constipation  No A fib or A flutter  EMMI video sent to pt's e mail -- pt has no email for emmi

## 2017-06-27 ENCOUNTER — Ambulatory Visit
Admission: RE | Admit: 2017-06-27 | Discharge: 2017-06-27 | Disposition: A | Discharge status: Home / Self Care | Payer: Medicare Other | Source: Ambulatory Visit | Attending: Internal Medicine | Admitting: Internal Medicine

## 2017-06-27 DIAGNOSIS — M542 Cervicalgia: Secondary | ICD-10-CM

## 2017-06-27 DIAGNOSIS — D352 Benign neoplasm of pituitary gland: Secondary | ICD-10-CM

## 2017-06-27 MED ORDER — GADOBENATE DIMEGLUMINE 529 MG/ML IV SOLN
7.0000 mL | Freq: Once | INTRAVENOUS | Status: AC | PRN
  Administered 2017-06-27: 7 mL via INTRAVENOUS

## 2017-07-10 ENCOUNTER — Ambulatory Visit (AMBULATORY_SURGERY_CENTER): Payer: Medicare Other | Admitting: Gastroenterology

## 2017-07-10 ENCOUNTER — Encounter: Payer: Self-pay | Admitting: Gastroenterology

## 2017-07-10 VITALS — BP 132/75 | HR 47 | Temp 98.4°F | Resp 13 | Ht 67.0 in | Wt 158.0 lb

## 2017-07-10 DIAGNOSIS — D122 Benign neoplasm of ascending colon: Secondary | ICD-10-CM | POA: Diagnosis not present

## 2017-07-10 DIAGNOSIS — D127 Benign neoplasm of rectosigmoid junction: Secondary | ICD-10-CM | POA: Diagnosis not present

## 2017-07-10 DIAGNOSIS — D129 Benign neoplasm of anus and anal canal: Secondary | ICD-10-CM

## 2017-07-10 DIAGNOSIS — Z1212 Encounter for screening for malignant neoplasm of rectum: Secondary | ICD-10-CM | POA: Diagnosis not present

## 2017-07-10 DIAGNOSIS — D128 Benign neoplasm of rectum: Secondary | ICD-10-CM

## 2017-07-10 DIAGNOSIS — Z1211 Encounter for screening for malignant neoplasm of colon: Secondary | ICD-10-CM

## 2017-07-10 MED ORDER — SODIUM CHLORIDE 0.9 % IV SOLN: 500.0000 mL | INTRAVENOUS | Status: DC

## 2017-07-10 NOTE — Progress Notes (Signed)
No problems noted in the recovery room. maw 

## 2017-07-10 NOTE — Progress Notes (Signed)
Called to room to assist during endoscopic procedure.  Patient ID and intended procedure confirmed with present staff. Received instructions for my participation in the procedure from the performing physician.  

## 2017-07-10 NOTE — Op Note (Signed)
[facility] Patient Name: Rodney Ayala Procedure Date: 07/10/2017 10:38 AM MRN: [id number] Endoscopist: Remo Lipps P. Armbruster MD, MD Age: 66 Referring MD:  Date of Birth: Dec 11, 1950 Gender: Male Account #: [id number] Procedure:                Colonoscopy Indications:              Screening for colorectal malignant neoplasm Medicines:                Monitored Anesthesia Care Procedure:                Pre-Anesthesia Assessment:                           - Prior to the procedure, a History and Physical                            was performed, and patient medications and                            allergies were reviewed. The patient's tolerance of                            previous anesthesia was also reviewed. The risks                            and benefits of the procedure and the sedation                            options and risks were discussed with the patient.                            All questions were answered, and informed consent                            was obtained. Prior Anticoagulants: The patient has                            taken no previous anticoagulant or antiplatelet                            agents. ASA Grade Assessment: II - A patient with                            mild systemic disease. After reviewing the risks                            and benefits, the patient was deemed in                            satisfactory condition to undergo the procedure.                           After obtaining informed consent, the colonoscope  was passed under direct vision. Throughout the                            procedure, the patient's blood pressure, pulse, and                            oxygen saturations were monitored continuously. The                            Colonoscope was introduced through the anus and                            advanced to the the cecum, identified by                            appendiceal orifice  and ileocecal valve. The                            colonoscopy was performed without difficulty. The                            patient tolerated the procedure well. The quality                            of the bowel preparation was good. The ileocecal                            valve, appendiceal orifice, and rectum were                            photographed. Scope In: 10:48:02 AM Scope Out: 11:02:41 AM Scope Withdrawal Time: 0 hours 13 minutes 3 seconds  Total Procedure Duration: 0 hours 14 minutes 39 seconds  Findings:                 The perianal and digital rectal examinations were                            normal.                           A 4 mm polyp was found in the ascending colon. The                            polyp was sessile. The polyp was removed with a                            cold snare. Resection and retrieval were complete.                           A 4 mm polyp was found in the recto-sigmoid colon.                            The polyp was flat. The polyp was removed with a  cold snare. Resection and retrieval were complete.                           Internal hemorrhoids were found during retroflexion.                           The rectal vault was small. The exam was otherwise                            without abnormality. Complications:            No immediate complications. Estimated blood loss:                            Minimal. Estimated Blood Loss:     Estimated blood loss was minimal. Impression:               - One 4 mm polyp in the ascending colon, removed                            with a cold snare. Resected and retrieved.                           - One 4 mm polyp at the recto-sigmoid colon,                            removed with a cold snare. Resected and retrieved.                           - Internal hemorrhoids.                           - The examination was otherwise normal. Recommendation:           - Patient has  a contact number available for                            emergencies. The signs and symptoms of potential                            delayed complications were discussed with the                            patient. Return to normal activities tomorrow.                            Written discharge instructions were provided to the                            patient.                           - Resume previous diet.                           - Continue present medications.                           -  Await pathology results.                           - Repeat colonoscopy is recommended for                            surveillance. The colonoscopy date will be                            determined after pathology results from today's                            exam become available for review.                           - No ibuprofen, naproxen, or other non-steroidal                            anti-inflammatory drugs for 2 weeks after polyp                            removal. Remo Lipps P. Armbruster MD, MD 07/10/2017 89:16:94 AM This report has been signed electronically.

## 2017-07-10 NOTE — Patient Instructions (Signed)
YOU HAD AN ENDOSCOPIC PROCEDURE TODAY AT THE [facility]:   Refer to the procedure report that was given to you for any specific questions about what was found during the examination.  If the procedure report does not answer your questions, please call your gastroenterologist to clarify.  If you requested that your care partner not be given the details of your procedure findings, then the procedure report has been included in a sealed envelope for you to review at your convenience later.  YOU SHOULD EXPECT: Some feelings of bloating in the abdomen. Passage of more gas than usual.  Walking can help get rid of the air that was put into your GI tract during the procedure and reduce the bloating. If you had a lower endoscopy (such as a colonoscopy or flexible sigmoidoscopy) you may notice spotting of blood in your stool or on the toilet paper. If you underwent a bowel prep for your procedure, you may not have a normal bowel movement for a few days.  Please Note:  You might notice some irritation and congestion in your nose or some drainage.  This is from the oxygen used during your procedure.  There is no need for concern and it should clear up in a day or so.  SYMPTOMS TO REPORT IMMEDIATELY:   Following lower endoscopy (colonoscopy or flexible sigmoidoscopy):  Excessive amounts of blood in the stool  Significant tenderness or worsening of abdominal pains  Swelling of the abdomen that is new, acute  Fever of 100F or higher   For urgent or emergent issues, a gastroenterologist can be reached at any hour by calling [phone].   DIET:  We do recommend a small meal at first, but then you may proceed to your regular diet.  Drink plenty of fluids but you should avoid alcoholic beverages for 24 hours.  ACTIVITY:  You should plan to take it easy for the rest of today and you should NOT DRIVE or use heavy machinery until tomorrow (because of the sedation medicines used during the test).     FOLLOW UP: Our staff will call the number listed on your records the next business day following your procedure to check on you and address any questions or concerns that you may have regarding the information given to you following your procedure. If we do not reach you, we will leave a message.  However, if you are feeling well and you are not experiencing any problems, there is no need to return our call.  We will assume that you have returned to your regular daily activities without incident.  If any biopsies were taken you will be contacted by phone or by letter within the next 1-3 weeks.  Please call us at [phone] if you have not heard about the biopsies in 3 weeks.    SIGNATURES/CONFIDENTIALITY: You and/or your care partner have signed paperwork which will be entered into your electronic medical record.  These signatures attest to the fact that that the information above on your After Visit Summary has been reviewed and is understood.  Full responsibility of the confidentiality of this discharge information lies with you and/or your care-partner.   Handouts were given to your care partner on polyps and hemorrhoids. NO ASPIRIN, ASPIRIN CONTAINING PRODUCTS (BC OR GOODY POWDERS) OR NSAIDS (IBUPROFEN, ADVIL, ALEVE, AND MOTRIN) FOR 2 weeks TYLENOL IS OK TO TAKE. You may resume your other current medications today. Await biopsy results. Please call if any questions or concerns.   

## 2017-07-10 NOTE — Addendum Note (Signed)
Addended by: Ernestine Conrad D on: 07/10/2017 12:56 PM   Modules accepted: Orders

## 2017-07-10 NOTE — Progress Notes (Signed)
Pt's mother was with pt and she left for a md appointment.  Pt's sister is also here with pt.  She has called another sister who is the driver.  Pt placed in the consultation room while waiting on his ride. No complaints noted in the recovery room. maw

## 2017-07-10 NOTE — Progress Notes (Signed)
Report to PACU, RN, vss, BBS= Clear.  

## 2017-07-11 ENCOUNTER — Telehealth: Payer: Self-pay

## 2017-07-11 NOTE — Telephone Encounter (Signed)
  Follow up Call-  Call back number 07/10/2017  Post procedure Call Back phone  # [phone]  Permission to leave phone message Yes  Some recent data might be hidden     Patient questions:  Do you have a fever, pain , or abdominal swelling? No. Pain Score  0 *  Have you tolerated food without any problems? Yes.    Have you been able to return to your normal activities? Yes.    Do you have any questions about your discharge instructions: Diet   No. Medications  No. Follow up visit  No.  Do you have questions or concerns about your Care? No.  Actions: * If pain score is 4 or above: No action needed, pain <4.  No problems noted per pt. maw

## 2017-07-13 ENCOUNTER — Encounter: Payer: Self-pay | Admitting: Gastroenterology

## 2017-07-26 ENCOUNTER — Ambulatory Visit (INDEPENDENT_AMBULATORY_CARE_PROVIDER_SITE_OTHER): Payer: Medicare HMO | Admitting: Pulmonary Disease

## 2017-07-26 ENCOUNTER — Encounter: Payer: Self-pay | Admitting: Pulmonary Disease

## 2017-07-26 VITALS — BP 116/72 | HR 72 | Ht 67.0 in | Wt 158.0 lb

## 2017-07-26 DIAGNOSIS — R0602 Shortness of breath: Secondary | ICD-10-CM

## 2017-07-26 DIAGNOSIS — R0609 Other forms of dyspnea: Secondary | ICD-10-CM | POA: Diagnosis not present

## 2017-07-26 MED ORDER — NICOTINE 7 MG/24HR TD PT24: 7.0000 mg | MEDICATED_PATCH | Freq: Every day | TRANSDERMAL | 1 refills | Status: DC

## 2017-07-26 NOTE — Addendum Note (Signed)
Addended by: Valerie Salts on: 07/26/2017 11:33 AM   Modules accepted: Orders

## 2017-07-26 NOTE — Patient Instructions (Signed)
Lung function appears okay Use albuterol MDI 2 puffs as needed for wheezing. You really have to quit smoking -we discussed various options

## 2017-07-26 NOTE — Progress Notes (Signed)
Subjective:    Patient ID: Rodney Ayala, male    DOB: Nov 24, 1950, 66 y.o.   MRN: [id number]  HPI  Chief Complaint  Patient presents with  . Pulm Consult    Referred by Dr. Alphonzo Grieve for SOB and wheezing. Possible COPD. Per patient, the symptoms varies. Tends to go 3-4 months w/o symptoms.    66 year old smoker presents for evaluation of dyspnea and wheezing. 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  He smoked 1 and 25 pack years starting as a teenager and has now cut down to a pack every 3 days. He reports dyspnea on walking on level ground for long distances and has trouble climbing stairs.  Reports wheezing that comes on every 2-3 months and lasts about 2-3 weeks before resolving without medications.  Albuterol MDI has helped somewhat with this wheezing. He also reports cough productive of thick yellow sputum every now and then that seems to clear with medications. He denies orthopnea, chest pain, proximal nocturnal dyspnea or pedal edema.  Chest x-ray 09/2016 showed mild left basilar atelectasis no evidence of hyperinflation otherwise clear lungs, reviewed by me  Spirometry surprisingly shows ratio 76, FEV1 of 79% and FVC of 80%, no evidence of airway obstruction   Past Medical History:  Diagnosis Date  . Arthritis   . GERD (gastroesophageal reflux disease)   . Hep C w/o coma, chronic ([place])   . Hyperlipidemia   . Hypothyroidism   . 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 COLONOSCOPY     15 + yrs ago at [facility] Reactions  . No Known Allergies      Social History   Socioeconomic History  . Marital status: Single    Spouse name: Not on file  . Number of children: Not on file  . Years of education: Not on file  . Highest education level: Not on file  Social Needs  . Financial resource strain: Not on file  . Food  insecurity - worry: Not on file  . Food insecurity - inability: Not on file  . Transportation needs - medical: Not on file  . Transportation needs - non-medical: Not on file  Occupational History  . Not on file  Tobacco Use  . Smoking status: Current Every Day Smoker    Packs/day: 0.25    Years: 45.00    Pack years: 11.25    Types: Cigarettes  . Smokeless tobacco: Never Used  . Tobacco comment: 3-4 cigs a day   Substance and Sexual Activity  . Alcohol use: No    Comment: 15 yrs- no ETOH now per pt   . Drug use: No    Comment: 15 yrs  . Sexual activity: Not on file  Other Topics Concern  . Not on file  Social History Narrative  . Not on file     Family History  Problem Relation Age of Onset  . Colon cancer Neg Hx   . Colon polyps Neg Hx   . Esophageal cancer Neg Hx   . Rectal cancer Neg Hx   . Stomach cancer Neg Hx      Review of Systems  Constitutional: negative for anorexia, fevers and sweats  Eyes: negative for irritation, redness and visual disturbance  Ears, nose, mouth, throat, and face: negative for earaches, epistaxis, nasal congestion and sore throat  Respiratory: negative for cough,  dyspnea on exertion, sputum and wheezing  Cardiovascular: negative for chest pain, dyspnea, lower extremity edema, orthopnea, palpitations and syncope  Gastrointestinal: negative for abdominal pain, constipation, diarrhea, melena, nausea and vomiting  Genitourinary:negative for dysuria, frequency and hematuria  Hematologic/lymphatic: negative for bleeding, easy bruising and lymphadenopathy  Musculoskeletal:negative for arthralgias, muscle weakness and stiff joints  Neurological: negative for coordination problems, gait problems, headaches and weakness  Endocrine: negative for diabetic symptoms including polydipsia, polyuria and weight loss     Objective:   Physical Exam  Gen. Pleasant, thin, in no distress, normal affect ENT - no lesions, no post nasal drip Neck: No JVD,  no thyromegaly, no carotid bruits Lungs: no use of accessory muscles, no dullness to percussion, decreased BL without rales or rhonchi  Cardiovascular: Rhythm regular, heart sounds  normal, no murmurs or gallops, no peripheral edema Abdomen: soft and non-tender, no hepatosplenomegaly, BS normal. Musculoskeletal: No deformities, no cyanosis or clubbing Neuro:  alert, non focal       Assessment & Plan:

## 2017-07-26 NOTE — Assessment & Plan Note (Signed)
Surprisingly does not have significant airway obstruction Use albuterol MDI 2 puffs as needed for wheezing.  Smoking cessation remains the most important intervention here at this point -we discussed various options ,he prefers to try nicotine patch

## 2017-08-02 DIAGNOSIS — L84 Corns and callosities: Secondary | ICD-10-CM | POA: Diagnosis not present

## 2017-08-14 ENCOUNTER — Telehealth: Payer: Self-pay | Admitting: Podiatry

## 2017-08-14 NOTE — Telephone Encounter (Signed)
My name is Nelson Noone and my phone number is [phone]. I have a question for the nurse. Can you call me back please.

## 2017-08-14 NOTE — Telephone Encounter (Signed)
Pt called to see which doctor he would be seeing. Informed pt he would be seeing Dr. Hardie Pulley 102/02/2017 at 2:30pm. Pt asked if he could change the doctor, and I told him he could but he may also be changing the appt time and date. Pt states he will see Dr. March Rummage.

## 2017-08-23 ENCOUNTER — Ambulatory Visit (INDEPENDENT_AMBULATORY_CARE_PROVIDER_SITE_OTHER): Payer: Medicare Other

## 2017-08-23 ENCOUNTER — Ambulatory Visit (INDEPENDENT_AMBULATORY_CARE_PROVIDER_SITE_OTHER): Payer: Medicare Other | Admitting: Podiatry

## 2017-08-23 ENCOUNTER — Encounter: Payer: Self-pay | Admitting: Podiatry

## 2017-08-23 DIAGNOSIS — G5781 Other specified mononeuropathies of right lower limb: Secondary | ICD-10-CM

## 2017-08-23 DIAGNOSIS — L84 Corns and callosities: Secondary | ICD-10-CM | POA: Diagnosis not present

## 2017-08-23 DIAGNOSIS — G5761 Lesion of plantar nerve, right lower limb: Secondary | ICD-10-CM | POA: Diagnosis not present

## 2017-09-12 ENCOUNTER — Encounter: Payer: Medicare Other | Admitting: Podiatry

## 2017-09-13 NOTE — Progress Notes (Signed)
Encounter created in error. Patient left without being seen.

## 2017-09-27 ENCOUNTER — Ambulatory Visit (INDEPENDENT_AMBULATORY_CARE_PROVIDER_SITE_OTHER): Payer: Medicare Other | Admitting: Podiatry

## 2017-09-27 ENCOUNTER — Telehealth: Payer: Self-pay | Admitting: *Deleted

## 2017-09-27 DIAGNOSIS — M21611 Bunion of right foot: Secondary | ICD-10-CM | POA: Diagnosis not present

## 2017-09-27 DIAGNOSIS — M79671 Pain in right foot: Secondary | ICD-10-CM

## 2017-09-27 DIAGNOSIS — M2011 Hallux valgus (acquired), right foot: Secondary | ICD-10-CM | POA: Diagnosis not present

## 2017-09-27 DIAGNOSIS — Z969 Presence of functional implant, unspecified: Secondary | ICD-10-CM | POA: Diagnosis not present

## 2017-09-27 DIAGNOSIS — M779 Enthesopathy, unspecified: Secondary | ICD-10-CM

## 2017-09-27 NOTE — Telephone Encounter (Signed)
I am calling you per Dr. March Rummage.  He wanted me to call and see if you are okay having the surgery on October 10, 2017.  "That date is fine with me."  I'll get it scheduled and you will receive a call from someone from the surgical center a day or two prior to your surgery date.  They will give you the arrival time.  "Okay, thank you."

## 2017-10-08 NOTE — Progress Notes (Signed)
Subjective:  Patient ID: Rodney Ayala, male    DOB: 1951-04-23,  MRN: [id number]  Chief Complaint  Patient presents with  . Callouses    Right foot, medial side and lateral sides of foot. Pt states had foot surgery 6-7 months ago    67 y.o. male presents with the above complaint.  Reports pain to the outside of the right foot.  Reports pain to the right great toe on the inside with painful callus.  States that he had surgery on his right foot about 6 months ago.  Past Medical History:  Diagnosis Date  . Arthritis   . GERD (gastroesophageal reflux disease)   . Hep C w/o coma, chronic ([place])   . Hyperlipidemia   . Hypothyroidism   . 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 COLONOSCOPY     15 + yrs ago at [facility]   . CRANIOTOMY N/A 04/10/2013   Procedure: CRANIOTOMY HYPOPHYSECTOMY TRANSNASAL APPROACH;  Surgeon: Hosie Spangle, MD;  Location: [place] [facility];  Service: Neurosurgery;  Laterality: N/A;  Transphenoidal resection of pituitary tumor  . LUMBAR LAMINECTOMY/DECOMPRESSION MICRODISCECTOMY N/A 01/25/2017   Procedure: Lumbar three-lumbar four  Lumbar Laminectomy;  Surgeon: [name], MD;  Location: [place];  Service: Neurosurgery;  Laterality: N/A;  . TRANSNASAL APPROACH N/A 04/10/2013   Procedure: TRANSNASAL APPROACH;  Surgeon: [name], MD;  Location: [facility];  Service: ENT;  Laterality: N/A;    Current Outpatient Medications:  .  albuterol (PROVENTIL HFA;VENTOLIN HFA) 108 (90 BASE) MCG/ACT inhaler, Inhale 2 puffs into the lungs every 6 (six) hours as needed for wheezing or shortness of breath. , Disp: , Rfl:  .  ANDRODERM 4 MG/24HR PT24 patch, Apply 1 patch topically daily. Uses a 4 mg and 2 mg patch together to equal 6 mg daily, Disp: , Rfl: 3 .  aspirin EC 81 MG tablet, Take 81 mg by mouth daily., Disp: , Rfl:  .  atorvastatin (LIPITOR) 10 MG tablet, Take 10 mg by mouth daily. , Disp: , Rfl:  .  CIALIS 5 MG tablet, Take 5 mg by  mouth daily as needed for erectile dysfunction. , Disp: , Rfl:  .  HYDROcodone-acetaminophen (NORCO/VICODIN) 5-325 MG tablet, Take 1-2 tablets by mouth every 4 (four) hours as needed for moderate pain., Disp: 40 tablet, Rfl: 0 .  hydrocortisone (CORTEF) 10 MG tablet, Take 5 mg by mouth daily. , Disp: , Rfl:  .  levothyroxine (SYNTHROID, LEVOTHROID) 88 MCG tablet, Take 88 mcg by mouth daily before breakfast., Disp: , Rfl:  .  meloxicam (MOBIC) 15 MG tablet, Take 15 mg by mouth daily. , Disp: , Rfl:  .  methocarbamol (ROBAXIN) 500 MG tablet, Take 1 tablet (500 mg total) by mouth 2 (two) times daily., Disp: 20 tablet, Rfl: 0 .  naproxen sodium (ANAPROX) 220 MG tablet, Take 220 mg by mouth daily as needed (pain)., Disp: , Rfl:  .  nicotine (NICODERM CQ - DOSED IN MG/24 HR) 7 mg/24hr patch, Place 1 patch (7 mg total) daily onto the skin., Disp: 28 patch, Rfl: 1 .  omeprazole (PRILOSEC) 20 MG capsule, Take 20 mg by mouth daily., Disp: , Rfl:  .  Testosterone (ANDRODERM) 2 MG/24HR PT24, Place 2 mg onto the skin daily. Uses a 2 mg and 4 mg patch together daily to equal 6 mg, Disp: , Rfl:  .  vitamin B-12 (CYANOCOBALAMIN) 500 MCG tablet, Take 500 mcg by mouth daily., Disp: ,  Rfl:   Allergies  Allergen Reactions  . No Known Allergies    Review of Systems Objective:  There were no vitals filed for this visit. General AA&O x3. Normal mood and affect.  Vascular Dorsalis pedis and posterior tibial pulses  present 1+ bilaterally  Capillary refill normal to all digits. Pedal hair growth diminished.  Neurologic Epicritic sensation grossly present.  Dermatologic No open lesions. Interspaces clear of maceration. Nails well groomed and normal in appearance.  Orthopedic: MMT 5/5 in dorsiflexion, plantarflexion, inversion, and eversion. Normal joint ROM without pain or crepitus. HAV deformtiy, Tailor's bunion deformity Pain to palpation 2nd interspace R 2 with mulder's click.   Assessment & Plan:  Patient  was evaluated and treated and all questions answered.  Capsulitis, 5th MPJ, Neuroma R Foot -XR rays reviewed.  No acute fracture dislocations.  Elongated second third metatarsals, slight HAV deformity, evidence of tailor's bunionectomy with pin fixation -Injection as below. -Patient would like to discuss surgical intervention for his painful tailor's bunion. Will discuss at next visit.  Procedure: Neuroma Injection Location: Right 2nd interspace Skin Prep: Alcohol. Injectate: 0.5 cc 0.5% marcaine plain, 0.5 cc dexamethasone phosphate. Disposition: Patient tolerated procedure well. Injection site dressed with a band-aid.   Return in about 3 weeks (around 09/13/2017) for neuroma.

## 2017-10-08 NOTE — Progress Notes (Signed)
  Subjective:  Patient ID: Rodney Ayala, male    DOB: 06/01/1951,  MRN: [id number]  Chief Complaint  Patient presents with  . Callouses    right foot / feels like the wire is trying to push through   67 y.o. male returns for the above complaint. Feels like the wire is trying to push through his foot.  Would like to discuss surgical options.  Objective:  There were no vitals filed for this visit. General AA&O x3. Normal mood and affect.  Vascular Pedal pulses palpable.  Neurologic Epicritic sensation grossly intact.  Dermatologic No open lesions. Skin normal texture and turgor.  Right hallux IPJ hyperkeratotic lesion  Orthopedic: Pain to palpation about the right first MPJ medially with moderate HAV deformity palpation about the medial aspect of the right hallux interphalangeal joint.  Pain palpation about the fifth MPJ with pain proximal to the MPJ at the site of the previous K wire   Assessment & Plan:  Patient was evaluated and treated and all questions answered.  R Foot Bunion Deformity, fifth metatarsal capsulitis with retained K wire -Patient has failed all conservative therapy and wishes to proceed with surgical correction.  All risks and symptoms include the patient guarantees given.  Consent reviewed and signed by patient.  We will proceed with surgical date as determined by surgical scheduler -ABIs performed today left 1.09 right 1.07  Return for after surgery.

## 2017-10-10 ENCOUNTER — Telehealth: Payer: Self-pay | Admitting: Podiatry

## 2017-10-10 ENCOUNTER — Other Ambulatory Visit: Payer: Self-pay | Admitting: Podiatry

## 2017-10-10 DIAGNOSIS — M2011 Hallux valgus (acquired), right foot: Secondary | ICD-10-CM | POA: Diagnosis not present

## 2017-10-10 DIAGNOSIS — M2041 Other hammer toe(s) (acquired), right foot: Secondary | ICD-10-CM | POA: Diagnosis not present

## 2017-10-10 DIAGNOSIS — M7751 Other enthesopathy of right foot: Secondary | ICD-10-CM | POA: Diagnosis not present

## 2017-10-10 DIAGNOSIS — Z4889 Encounter for other specified surgical aftercare: Secondary | ICD-10-CM | POA: Diagnosis not present

## 2017-10-10 MED ORDER — OXYCODONE-ACETAMINOPHEN 10-325 MG PO TABS: 1.0000 | ORAL_TABLET | ORAL | 0 refills | Status: DC | PRN

## 2017-10-10 MED ORDER — CEPHALEXIN 500 MG PO CAPS: 500.0000 mg | ORAL_CAPSULE | Freq: Two times a day (BID) | ORAL | 0 refills | Status: DC

## 2017-10-10 MED ORDER — ONDANSETRON HCL 4 MG PO TABS: 4.0000 mg | ORAL_TABLET | Freq: Three times a day (TID) | ORAL | 0 refills | Status: AC | PRN

## 2017-10-10 NOTE — Telephone Encounter (Signed)
This is Rodney Ayala. I had surgery this morning on my foot. I just have a couple of questions. Could you call me back at [phone] please.

## 2017-10-10 NOTE — Telephone Encounter (Signed)
I spoke with pt and he asked if he had to sleep in the boot. I told him yes and to walk in the boot and sleep in the boot, that he could only be up on the foot or have the foot below his heart 45minutes/hour, and if resting he could have the boot open. Pt states understanding.

## 2017-10-11 ENCOUNTER — Telehealth: Payer: Self-pay | Admitting: Podiatry

## 2017-10-11 NOTE — Telephone Encounter (Signed)
I spoke with pt and he states he didn't sleep well last night because he only took one pain pill. Pt states he'll know better tonight. Pt states if he walked the whole house it would not take him 15 minutes. I told pt that we did not want him to walk more than 15 minutes/hour and it should always be in the boot. Pt states understanding.

## 2017-10-11 NOTE — Telephone Encounter (Signed)
Yes my name is Jos Cygan. You can reach me at [phone]. I have a question for Dr. March Rummage. Thank you.

## 2017-10-15 ENCOUNTER — Telehealth: Payer: Self-pay | Admitting: *Deleted

## 2017-10-15 NOTE — Telephone Encounter (Signed)
Called and spoke with the patient today and stated that I was calling to check on him after the surgery last Wednesday 10/10/2017 and patient stated that he was doing much better and that there was little pain and the pain scale being a 0 and no fever or chills and no vomiting and is taken the pain medicine as instructed and the antibiotic (Cephalexin) and I stated that we would see the patient on Wednesday of this week and to call the office if any concerns or questions at [phone]. Lattie Haw

## 2017-10-17 ENCOUNTER — Ambulatory Visit (INDEPENDENT_AMBULATORY_CARE_PROVIDER_SITE_OTHER): Payer: Medicare Other

## 2017-10-17 ENCOUNTER — Ambulatory Visit (INDEPENDENT_AMBULATORY_CARE_PROVIDER_SITE_OTHER): Payer: Medicare Other | Admitting: Podiatry

## 2017-10-17 DIAGNOSIS — M79671 Pain in right foot: Secondary | ICD-10-CM

## 2017-10-17 NOTE — Progress Notes (Signed)
  Subjective:  Patient ID: Rodney Ayala, male    DOB: 1950/10/09,  MRN: [id number]  No chief complaint on file.   DOS: 10/10/2017 Procedure: Right foot Austin bunionectomy, hemi condylectomy, fifth metatarsal head excision, removal of hardware reports pain is mild and is not having to take any pain medication.  67 y.o. male returns for post-op check. Denies N/V/F/Ch. Pain is controlled with current medications.  Objective:   General AA&O x3. Normal mood and affect.  Vascular Foot warm and well perfused.  Neurologic Gross sensation intact.  Dermatologic Skin healing well without signs of infection. Skin edges well coapted without signs of infection.  Orthopedic: Tenderness to palpation noted about the surgical site.    Assessment & Plan:  Patient was evaluated and treated and all questions answered.  S/p right foot Austin bunionectomy, hemi-condylectomy, fifth metatarsal head excision, removal of hardware -X-rays taken and reviewed consistent with postop state no evidence of hardware failure. -Progressing as expected post-operatively. -Sutures: Left intact. -Medications refilled: None -Foot redressed.  Return in about 1 week (around 10/24/2017).

## 2017-10-24 ENCOUNTER — Ambulatory Visit (INDEPENDENT_AMBULATORY_CARE_PROVIDER_SITE_OTHER): Payer: Medicare Other | Admitting: Podiatry

## 2017-10-24 ENCOUNTER — Encounter: Payer: Self-pay | Admitting: Podiatry

## 2017-10-24 DIAGNOSIS — Z9889 Other specified postprocedural states: Secondary | ICD-10-CM

## 2017-10-24 MED ORDER — OXYCODONE-ACETAMINOPHEN 5-325 MG PO TABS: 1.0000 | ORAL_TABLET | ORAL | 0 refills | Status: DC | PRN

## 2017-10-24 NOTE — Progress Notes (Signed)
  Subjective:  Patient ID: Rodney Ayala, male    DOB: December 09, 1950,  MRN: [id number]  Chief Complaint  Patient presents with  . Routine Post Op    i am doing good and i did hit my 5th toe on my right     DOS: 10/10/17 Procedure: R bunionectomy, Hemi condylectomy, fifth metatarsal head excision, removal of hardware  67 y.o. male returns for post-op check. Denies N/V/F/Ch. Pain is controlled with current medications.  States that he hit his right fifth toe.  States she is doing better. Objective:   General AA&O x3. Normal mood and affect.  Vascular Foot warm and well perfused.  Neurologic Gross sensation intact.  Dermatologic Skin healing well without signs of infection. Skin edges well coapted without signs of infection.  Orthopedic: Tenderness to palpation noted about the surgical site.    Assessment & Plan:  Patient was evaluated and treated and all questions answered.  S/p R bunionectomy, Hemi condylectomy, fifth metatarsal head excision, removal of hardware -Progressing as expected post-operatively. -Sutures: removed. -Medications refilled: Percocet -Foot redressed.  Return in about 2 weeks (around 11/07/2017) for Post-op.

## 2017-11-08 ENCOUNTER — Ambulatory Visit (INDEPENDENT_AMBULATORY_CARE_PROVIDER_SITE_OTHER): Payer: Medicare Other

## 2017-11-08 ENCOUNTER — Ambulatory Visit (INDEPENDENT_AMBULATORY_CARE_PROVIDER_SITE_OTHER): Payer: Medicare Other | Admitting: Podiatry

## 2017-11-08 DIAGNOSIS — M79671 Pain in right foot: Secondary | ICD-10-CM

## 2017-11-08 DIAGNOSIS — M2041 Other hammer toe(s) (acquired), right foot: Secondary | ICD-10-CM | POA: Diagnosis not present

## 2017-11-08 DIAGNOSIS — Z9889 Other specified postprocedural states: Secondary | ICD-10-CM

## 2017-11-08 DIAGNOSIS — M2011 Hallux valgus (acquired), right foot: Secondary | ICD-10-CM | POA: Diagnosis not present

## 2017-11-08 NOTE — Progress Notes (Signed)
am  Subjective:  Patient ID: Rodney Ayala, male    DOB: [birth year],  MRN: [id number]  No chief complaint on file.   DOS: 10/10/17 Procedure: Gearlean Alf Bunionectomy, Hemicondylectomy, fifth metatarsal head excision, removal of hardware.  67 y.o. male returns for post-op check. Denies N/V/F/Ch.  Thinks the incision was draining.  Denies odor.  Still having pain to the outside edge of the foot.  Objective:   General AA&O x3. Normal mood and affect.  Vascular Foot warm and well perfused.  Neurologic Gross sensation intact.  Dermatologic Skin well healed without signs of infection.  No evidence of dehiscence.  No evidence of infection.  Orthopedic: Tenderness to palpation noted about the surgical sites.    Assessment & Plan:  Patient was evaluated and treated and all questions answered.  S/p R Austin Bunionectomy, Hemicondylectomy, fifth metatarsal head excision, removal of hardware. -Progressing as expected post-operatively. -X-rays taken today show healing of the osteotomy without evidence of hardware failure -Refer to PT  No Follow-up on file.

## 2017-11-08 NOTE — Patient Instructions (Signed)
Recommended Lotions:  Gold Bond Diabetic Lotion [employer]

## 2017-11-22 ENCOUNTER — Ambulatory Visit (INDEPENDENT_AMBULATORY_CARE_PROVIDER_SITE_OTHER): Payer: Medicare Other | Admitting: Podiatry

## 2017-11-22 ENCOUNTER — Other Ambulatory Visit: Payer: Self-pay | Admitting: Podiatry

## 2017-11-22 ENCOUNTER — Ambulatory Visit (INDEPENDENT_AMBULATORY_CARE_PROVIDER_SITE_OTHER): Payer: Medicare Other

## 2017-11-22 DIAGNOSIS — M2011 Hallux valgus (acquired), right foot: Secondary | ICD-10-CM

## 2017-11-22 NOTE — Progress Notes (Signed)
dg 

## 2017-12-04 ENCOUNTER — Telehealth: Payer: Self-pay | Admitting: Podiatry

## 2017-12-04 NOTE — Telephone Encounter (Signed)
Pt is requesting pain medication. 

## 2017-12-05 ENCOUNTER — Other Ambulatory Visit: Payer: Self-pay | Admitting: Podiatry

## 2017-12-05 MED ORDER — HYDROCODONE-ACETAMINOPHEN 5-325 MG PO TABS: 1.0000 | ORAL_TABLET | ORAL | 0 refills | Status: AC | PRN

## 2017-12-05 NOTE — Progress Notes (Signed)
Rx for Vicodin sent.

## 2017-12-05 NOTE — Telephone Encounter (Signed)
I informed pt the hydrocodone had been called to the [id number].

## 2017-12-16 NOTE — Progress Notes (Signed)
am  Subjective:  Patient ID: Rodney Ayala, male    DOB: 01/04/1951,  MRN: [id number]  Chief Complaint  Patient presents with  . Routine Post Op    DOS 10/10/17 Rt Austin bunionectomy, hemicondylectomy, 5th metatarsal head excision, removal of hardware Pt. stated," Improving, but at night I've been having 10/10 stabbing pain. Also, Feels like nerves are clumping together."    DOS: 10/10/17 Procedure: Gearlean Alf Bunionectomy, Hemicondylectomy, fifth metatarsal head excision, removal of hardware.  67 y.o. male returns for post-op check.  Is is improving still having some stabbing pain at night.  States that the nerves are coming together. Objective:   General AA&O x3. Normal mood and affect.  Vascular Foot warm and well perfused.  Neurologic Gross sensation intact.  Dermatologic Skin well healed without signs of infection.  No evidence of dehiscence.  No evidence of infection.  Orthopedic: Tenderness to palpation noted about the surgical sites.    Assessment & Plan:  Patient was evaluated and treated and all questions answered.  S/p R Austin Bunionectomy, Hemicondylectomy, fifth metatarsal head excision, removal of hardware. -Progressing as expected post-operatively. -X-rays taken today show healing of the osteotomy without evidence of hardware failure -Continue PT  Return in about 4 weeks (around 12/20/2017) for Post-op.

## 2017-12-20 ENCOUNTER — Ambulatory Visit (INDEPENDENT_AMBULATORY_CARE_PROVIDER_SITE_OTHER): Payer: Medicare Other | Admitting: Podiatry

## 2017-12-20 DIAGNOSIS — Z9889 Other specified postprocedural states: Secondary | ICD-10-CM

## 2018-01-03 ENCOUNTER — Ambulatory Visit (INDEPENDENT_AMBULATORY_CARE_PROVIDER_SITE_OTHER): Payer: Medicare Other | Admitting: Podiatry

## 2018-01-03 DIAGNOSIS — Z9889 Other specified postprocedural states: Secondary | ICD-10-CM

## 2018-01-13 NOTE — Progress Notes (Signed)
am  Subjective:  Patient ID: Rodney Ayala, male    DOB: [birth year],  MRN: [id number]  Chief Complaint  Patient presents with  . Routine Post Op    still having some stiffness and numbness in the right great toe , but otherwise feels great!    DOS: 10/10/17 Procedure: Gearlean Alf Bunionectomy, Hemicondylectomy, fifth metatarsal head excision, removal of hardware.  67 y.o. male returns for post-op check. Doing well. States he feels great.  Reports his toe still little stiff and occasional numbness but otherwise he feels great.  Objective:   General AA&O x3. Normal mood and affect.  Vascular Foot warm and well perfused.  Neurologic Gross sensation intact.  Dermatologic Skin well healed without signs of infection.  No evidence of dehiscence.  No evidence of infection.  Orthopedic: Tenderness to palpation noted about the surgical sites.    Assessment & Plan:  Patient was evaluated and treated and all questions answered.  S/p R Austin Bunionectomy, Hemicondylectomy, fifth metatarsal head excision, removal of hardware. -Progressing as expected post-operatively. -Continue WBAT in normal shoegear.  Return in about 3 months (around 04/04/2018) for Bunion f/u.

## 2018-01-13 NOTE — Progress Notes (Signed)
am  Subjective:  Patient ID: Rodney Ayala, male    DOB: 1950/09/21,  MRN: [id number]  No chief complaint on file.   DOS: 10/10/17 Procedure: Gearlean Alf Bunionectomy, Hemicondylectomy, fifth metatarsal head excision, removal of hardware.  67 y.o. male returns for post-op check. Doing well. Pleased with results from surgery. Has been doing PT.  Objective:   General AA&O x3. Normal mood and affect.  Vascular Foot warm and well perfused.  Neurologic Gross sensation intact.  Dermatologic Skin well healed without signs of infection.  No evidence of dehiscence.  No evidence of infection.  Orthopedic: Tenderness to palpation noted about the surgical sites.    Assessment & Plan:  Patient was evaluated and treated and all questions answered.  S/p R Austin Bunionectomy, Hemicondylectomy, fifth metatarsal head excision, removal of hardware. -Progressing as expected post-operatively. -Continue WBAT in normal shoegear. -Continue PT to MMI.  F/u in 4 weeks with new XR.  No follow-ups on file.

## 2018-02-28 ENCOUNTER — Ambulatory Visit (INDEPENDENT_AMBULATORY_CARE_PROVIDER_SITE_OTHER): Payer: Medicare Other

## 2018-02-28 ENCOUNTER — Ambulatory Visit (INDEPENDENT_AMBULATORY_CARE_PROVIDER_SITE_OTHER): Payer: Medicare Other | Admitting: Podiatry

## 2018-02-28 ENCOUNTER — Other Ambulatory Visit: Payer: Self-pay

## 2018-02-28 DIAGNOSIS — M21611 Bunion of right foot: Secondary | ICD-10-CM

## 2018-02-28 DIAGNOSIS — M2011 Hallux valgus (acquired), right foot: Secondary | ICD-10-CM | POA: Diagnosis not present

## 2018-02-28 DIAGNOSIS — Z969 Presence of functional implant, unspecified: Secondary | ICD-10-CM | POA: Diagnosis not present

## 2018-03-17 NOTE — Progress Notes (Signed)
am  Subjective:  Patient ID: Rodney Ayala, male    DOB: 1951/04/16,  MRN: [id number]  No chief complaint on file.   DOS: 10/10/17 Procedure: Gearlean Alf Bunionectomy, Hemicondylectomy, fifth metatarsal head excision, removal of hardware.  67 y.o. male returns for post-op check. Doing well.  Still having some occasional sharp pains  Objective:   General AA&O x3. Normal mood and affect.  Vascular Foot warm and well perfused.  Neurologic Gross sensation intact.  Dermatologic Skin well-healed without   Orthopedic:  Palpable screw with pain palpation    Assessment & Plan:  Patient was evaluated and treated and all questions answered.  S/p R Austin Bunionectomy, Hemicondylectomy, fifth metatarsal head excision, removal of hardware. -Progressing as expected post-operatively. -Slight tenderness palpation at the site of the screw discussed possible screw removal.  Patient wishes to hold off at this time.  Return if symptoms worsen or fail to improve.

## 2018-04-03 DIAGNOSIS — R42 Dizziness and giddiness: Secondary | ICD-10-CM | POA: Diagnosis not present

## 2018-04-03 DIAGNOSIS — E785 Hyperlipidemia, unspecified: Secondary | ICD-10-CM | POA: Diagnosis not present

## 2018-04-03 DIAGNOSIS — E230 Hypopituitarism: Secondary | ICD-10-CM | POA: Diagnosis not present

## 2018-04-03 DIAGNOSIS — Z720 Tobacco use: Secondary | ICD-10-CM | POA: Diagnosis not present

## 2018-06-22 ENCOUNTER — Encounter (HOSPITAL_COMMUNITY): Payer: Self-pay | Admitting: Emergency Medicine

## 2018-06-22 ENCOUNTER — Other Ambulatory Visit: Payer: Self-pay

## 2018-06-22 ENCOUNTER — Emergency Department (HOSPITAL_COMMUNITY)
Admission: EM | Admit: 2018-06-22 | Discharge: 2018-06-22 | Disposition: A | Discharge status: Home / Self Care | Payer: Medicare Other | Attending: Emergency Medicine | Admitting: Emergency Medicine

## 2018-06-22 DIAGNOSIS — L03113 Cellulitis of right upper limb: Secondary | ICD-10-CM

## 2018-06-22 DIAGNOSIS — Z7982 Long term (current) use of aspirin: Secondary | ICD-10-CM | POA: Insufficient documentation

## 2018-06-22 DIAGNOSIS — F17210 Nicotine dependence, cigarettes, uncomplicated: Secondary | ICD-10-CM | POA: Diagnosis not present

## 2018-06-22 DIAGNOSIS — Z79899 Other long term (current) drug therapy: Secondary | ICD-10-CM | POA: Insufficient documentation

## 2018-06-22 DIAGNOSIS — E039 Hypothyroidism, unspecified: Secondary | ICD-10-CM | POA: Diagnosis not present

## 2018-06-22 DIAGNOSIS — R2231 Localized swelling, mass and lump, right upper limb: Secondary | ICD-10-CM | POA: Diagnosis present

## 2018-06-22 MED ORDER — HYDROXYZINE HCL 10 MG PO TABS
10.0000 mg | ORAL_TABLET | Freq: Once | ORAL | Status: AC
  Administered 2018-06-22: 10 mg via ORAL
  Filled 2018-06-22: qty 1

## 2018-06-22 MED ORDER — CEPHALEXIN 500 MG PO CAPS: 500.0000 mg | ORAL_CAPSULE | Freq: Four times a day (QID) | ORAL | 0 refills | Status: DC

## 2018-06-22 MED ORDER — HYDROXYZINE HCL 10 MG PO TABS: 10.0000 mg | ORAL_TABLET | Freq: Three times a day (TID) | ORAL | 0 refills | Status: AC

## 2018-06-22 NOTE — ED Provider Notes (Signed)
[facility] DEPT Provider Note   CSN: [id number] Arrival date & time: 06/22/18  1215     History   Chief Complaint Chief Complaint  Patient presents with  . Arm Swelling    HPI Rodney Ayala is a 67 y.o. male with a history of panhypopituitarism, arthritis, GERD, and HLD who presents to the emergency department with a chief complaint of right hand swelling and redness.  The patient endorses sudden onset, gradually worsening swelling to the right hand.  He reports that prior to onset of swelling that he was helping a friend with yard work.  He states that he was wearing gloves most of the day, but took off the gloves for a few minutes when he thinks he felt a bite on his hand. He did not see an insect at that time.  He reports that he put his clothes back on and return to work.  He reports at the end of the day that he noticed swelling to the dorsum of the right hand, but felt that it would go away overnight.  When he awoke this morning, he states that the swelling had worsened and he could not make out the outline of his knuckles so he came to the emergency department for evaluation.  He reports the area is minimally pruritic.  He reports a small area of pain in between his fourth and fifth digits, but denies he has no hand pain, wrist pain, weakness, numbness, fever, or chills.  No known falls, trauma, or injuries.  He reports that he had a spider bite several years ago that he had significant pain and swelling with, but denies significant reactions to mosquito or other bug bites in the past.  No known allergies to medications.  No history of diabetes mellitus or HIV.  The history is provided by the patient. No language interpreter was used.    Past Medical History:  Diagnosis Date  . Arthritis   . GERD (gastroesophageal reflux disease)   . Hep C w/o coma, chronic ([place])   . Hyperlipidemia   . Hypothyroidism   . Panhypopituitarism [facility])     Patient  Active Problem List   Diagnosis Date Noted  . Exertional dyspnea 07/26/2017  . Lumbar stenosis 01/25/2017    Past Surgical History:  Procedure Laterality Date  . COLONOSCOPY     15 + yrs ago at [facility]   . CRANIOTOMY N/A 04/10/2013   Procedure: CRANIOTOMY HYPOPHYSECTOMY TRANSNASAL APPROACH;  Surgeon: Hosie Spangle, MD;  Location: [facility]UR[facility] ORS;  Service: Neurosurgery;  Laterality: N/A;  Transphenoidal resection of pituitary tumor  . LUMBAR LAMINECTOMY/DECOMPRESSION MICRODISCECTOMY N/A 01/25/2017   Procedure: Lumbar three-lumbar four  Lumbar Laminectomy;  Surgeon: [name], MD;  Location: [facility];  Service: Neurosurgery;  Laterality: N/A;  . TRANSNASAL APPROACH N/A 04/10/2013   Procedure: TRANSNASAL APPROACH;  Surgeon: [name], MD;  Location: [facility];  Service: ENT;  Laterality: N/A;        Home Medications    Prior to Admission medications   Medication Sig Start Date End Date Taking? Authorizing Provider  albuterol (PROVENTIL HFA;VENTOLIN HFA) 108 (90 BASE) MCG/ACT inhaler Inhale 2 puffs into the lungs every 6 (six) hours as needed for wheezing or shortness of breath.     [provider]  ANDRODERM 4 MG/24HR PT24 patch Apply 1 patch topically daily. Uses a 4 mg and 2 mg patch together to equal 6 mg daily 08/24/15   [provider]  aspirin EC 81 MG tablet Take 81 mg by mouth daily.    [provider]  atorvastatin (LIPITOR) 10 MG tablet Take 10 mg by mouth daily.  08/28/16   [provider]  cephALEXin (KEFLEX) 500 MG capsule Take 1 capsule (500 mg total) by mouth 4 (four) times daily. 06/22/18   Shawntez Dickison A, PA-C  CIALIS 5 MG tablet Take 5 mg by mouth daily as needed for erectile dysfunction.  08/28/16   [provider]  HYDROcodone-acetaminophen (NORCO/VICODIN) 5-325 MG tablet Take 1-2 tablets by mouth every 4 (four) hours as needed (pain). 12/05/17   Evelina Bucy, DPM  hydrocortisone (CORTEF) 10 MG tablet Take 5 mg by  mouth daily.     [provider]  hydrOXYzine (ATARAX/VISTARIL) 10 MG tablet Take 1 tablet (10 mg total) by mouth 3 (three) times daily. 06/22/18   Eliabeth Shoff A, PA-C  levothyroxine (SYNTHROID, LEVOTHROID) 88 MCG tablet Take 88 mcg by mouth daily before breakfast.    [provider]  meloxicam (MOBIC) 15 MG tablet Take 15 mg by mouth daily.  08/31/16   [provider]  methocarbamol (ROBAXIN) 500 MG tablet Take 1 tablet (500 mg total) by mouth 2 (two) times daily. 04/06/17   [name], PA-C  naproxen sodium (ANAPROX) 220 MG tablet Take 220 mg by mouth daily as needed (pain).    [provider]  nicotine (NICODERM CQ - DOSED IN MG/24 HR) 7 mg/24hr patch Place 1 patch (7 mg total) daily onto the skin. 07/26/17   Rigoberto Noel, MD  omeprazole (PRILOSEC) 20 MG capsule Take 20 mg by mouth daily.    [provider]  ondansetron (ZOFRAN) 4 MG tablet Take 1 tablet (4 mg total) by mouth every 8 (eight) hours as needed for nausea or vomiting. 10/10/17   Evelina Bucy, DPM  RESTASIS 0.05 % ophthalmic emulsion INSTILL 1 DROP INTO BOTH EYES TWICE A DAY 01/15/18   [provider]  Testosterone (ANDRODERM) 2 MG/24HR PT24 Place 2 mg onto the skin daily. Uses a 2 mg and 4 mg patch together daily to equal 6 mg    [provider]  vitamin B-12 (CYANOCOBALAMIN) 500 MCG tablet Take 500 mcg by mouth daily.    [provider]    Family History Family History  Problem Relation Age of Onset  . Colon cancer Neg Hx   . Colon polyps Neg Hx   . Esophageal cancer Neg Hx   . Rectal cancer Neg Hx   . Stomach cancer Neg Hx     Social History Social History   Tobacco Use  . Smoking status: Current Every Day Smoker    Packs/day: 0.25    Years: 45.00    Pack years: 11.25    Types: Cigarettes  . Smokeless tobacco: Never Used  . Tobacco comment: 3-4 cigs a day   Substance Use Topics  . Alcohol use: No    Comment: 15 yrs- no ETOH now per pt     . Drug use: No    Types: "Crack" cocaine, Cocaine, Marijuana    Comment: 15 yrs     Allergies   No known allergies   Review of Systems Review of Systems  Constitutional: Negative for activity change, chills and fever.  Respiratory: Negative for shortness of breath.   Cardiovascular: Negative for chest pain.  Gastrointestinal: Negative for abdominal pain, diarrhea, nausea and vomiting.  Musculoskeletal: Positive for myalgias. Negative for arthralgias, back pain and joint swelling.  Skin: Positive for color change. Negative for pallor, rash and wound.  Neurological: Negative for weakness and numbness.     Physical Exam Updated Vital Signs BP 135/77 (BP Location: Left Arm)   Pulse (!) 56   Temp 98 F (36.7 C) (Oral)   Resp 19   Ht 5\' 9"  (1.753 m)   Wt 68.5 kg   SpO2 95%   BMI 22.30 kg/m   Physical Exam  Constitutional: He appears well-developed.  HENT:  Head: Normocephalic.  Eyes: Conjunctivae are normal.  Neck: Neck supple.  Cardiovascular: Normal rate and regular rhythm.  No murmur heard. Pulmonary/Chest: Effort normal.  Abdominal: Soft. He exhibits no distension.  Musculoskeletal:  Erythema and edema noted to the dorsum of the right hand.  No tenderness to palpation throughout the right hand or digits.  Edema and erythema extends past the right wrist on the ulnar/lateral aspect.  Full active and passive range of motion of all joints of all digits of the right hand and right wrist.  No pain with active or passive range of motion.  There is a small area of erythema in the interdigital space of the fourth and fifth digits of the right hand, but no obvious puncture wound.  Radial pulses are 2+ and symmetric.  Sensation is intact and equal throughout.   Neurological: He is alert.  Skin: Skin is warm and dry.  Psychiatric: His behavior is normal.  Nursing note and vitals reviewed.      ED Treatments / Results  Labs (all labs ordered are listed, but only abnormal  results are displayed) Labs Reviewed - No data to display  EKG None  Radiology No results found.  Procedures Procedures (including critical care time)  Medications Ordered in ED Medications  hydrOXYzine (ATARAX/VISTARIL) tablet 10 mg (10 mg Oral Given 06/22/18 1325)     Initial Impression / Assessment and Plan / ED Course  I have reviewed the triage vital signs and the nursing notes.  Pertinent labs & imaging results that were available during my care of the patient were reviewed by me and considered in my medical decision making (see chart for details).     67 year old male with a history of arthritis, panhypopituitararism, and HLD presenting with right hand erythema and edema after performing yard work yesterday.  He is uncertain if he was bitten by an insect prior to the onset of symptoms.  The patient was discussed and independently evaluated by Dr. Kathrynn Humble, attending physician.  On exam, edema is noted to the dorsum of the right hand that extends over the ulnar lateral aspect of the right wrist.  There is mild erythema and warmth to the area.  No obvious puncture wound or site of an insect bite.  He does have a minimal amount of pain in the interdigital space of the fourth and fifth digits.  Although his symptoms could be due to a histamine related reaction from a possible insect bite, given worsening edema for greater than 12 hours with mild redness and warmth, will prophylactically cover the patient for cellulitis of the right hand.  Will give a small reduced dose of Atarax for pruritus given the patient's age.  Medication precautions given.  Also recommended applying ice to the area and over-the-counter Benadryl cream.  Recommended follow-up with his primary care provider for wound recheck if the area that was marked with a skin marker extends beyond that line within a 48 to 72 hours.  He was also given strict return precautions to  the ED.  He is hemodynamically in no acute  distress.  He is safe for discharge to home with outpatient follow-up at this time.  Final Clinical Impressions(s) / ED Diagnoses   Final diagnoses:  Cellulitis of right hand    ED Discharge Orders         Ordered    cephALEXin (KEFLEX) 500 MG capsule  4 times daily     06/22/18 1402    hydrOXYzine (ATARAX/VISTARIL) 10 MG tablet  3 times daily     06/22/18 1402           Tyri Elmore A, PA-C 06/22/18 [place], Ankit, MD 06/23/18 0740

## 2018-06-22 NOTE — ED Notes (Signed)
Bed: WTR9 Expected date:  Expected time:  Means of arrival:  Comments: 

## 2018-06-22 NOTE — ED Triage Notes (Signed)
Patient here from home with complaints of right hand swelling. States that he was bit by a insect on yesterday. Denies pain, "just tingly".

## 2018-06-22 NOTE — Discharge Instructions (Addendum)
Thank you for allowing me to care for you today in the Emergency Department.   Take 1 tablet of Keflex by mouth every 6 hours for the next 5 days.  Benadryl ointment is available over-the-counter and can be applied to the skin of the right hand as directed on the label.  This can help with pain and swelling.  For significant itching, you can take 1 tablet of Atarax only as needed.  You can take 1 of these tablets every 8 hours.  Please do not drive or work while taking this medication because it can make you drowsy.  You can also use Benadryl instead of Atarax as directed on the label for itching and swelling.   Apply ice for 15 to 20 minutes as frequently as needed to help with swelling.  After you have been taking antibiotics for 48 to 72 hours, the area of swelling and redness on your hand that was marked should start to decrease.  If swelling or redness extends past this mark after 2 to 3 days, please follow-up with your primary care provider.    If you have new or worsening symptoms including fever, chills, red streaking that extends up the arm, or if you start to have an area that has thick, mucus-like drainage from the hand or fingers, you should return to the emergency department for reevaluation.

## 2018-08-02 ENCOUNTER — Other Ambulatory Visit: Payer: Self-pay

## 2018-08-02 NOTE — Patient Outreach (Signed)
[facility]) Care Management  08/02/2018  Rodney Ayala 09/02/1951 [id number]   Medication adherence call to Mr. Diana Armijo left a message for patient to call back patient is due on Atorvastatin 10 mg under [place].   [facility] Management Direct Dial [phone]  Fax [phone] [email or web address]

## 2018-10-08 ENCOUNTER — Other Ambulatory Visit: Payer: Self-pay | Admitting: Internal Medicine

## 2018-10-08 ENCOUNTER — Ambulatory Visit
Admission: RE | Admit: 2018-10-08 | Discharge: 2018-10-08 | Disposition: A | Discharge status: Home / Self Care | Payer: Medicare Other | Source: Ambulatory Visit | Attending: Internal Medicine | Admitting: Internal Medicine

## 2018-10-08 DIAGNOSIS — M5441 Lumbago with sciatica, right side: Secondary | ICD-10-CM

## 2019-05-19 NOTE — Progress Notes (Signed)
@Patient  ID: Rodney Ayala, male    DOB: [birth year], 68 y.o.   MRN: [id number]  Chief Complaint  Patient presents with   Follow-up    Per patient's PCP for "rattling his chest" about a month ago. Denies any current breathing problems.     Referring provider: Leeroy Cha  HPI:  68 year old male current smoker initially referred to our office in 2018 for exertional dyspnea  Smoker/ Smoking History: Current smoker.  27-pack-year smoking history. Maintenance:  None  Pt of: Dr. Elsworth Soho  05/20/2019  - Visit   68 year old male current every day smoker (smoking 3 to 4 cigarettes a day) presenting back to our office today for reevaluation at the recommendation of his primary care provider.  Patient was last seen in our office in 2018 by Dr. Elsworth Soho.  Patient reports he has no current symptoms or respiratory complaints today.  Patient does continue to smoke 3 to 4 cigarettes a day, he reports he is using nicotine replacement therapies (2 mg Nicorette gum) to work on stopping smoking.  He has not set a quit date yet.  Patient is due for the flu vaccine as well as Pneumovax 23.  He declined them both today.  He would like to receive these from his primary care provider.  Patient does have a rescue inhaler at home but he has not had to use this over the last 2 months.    Tests:   09/05/2015-CBC with differential- eosinophils relative 4, eosinophils absolute 0.3  07/26/2017-spirometry- FVC 2.8 (80% predicted), ratio 76, FEV1 2.1 (79% predicted)  FENO:  No results found for: NITRICOXIDE  PFT: No flowsheet data found.  Imaging: Dg Chest 2 View  Result Date: 05/20/2019 CLINICAL DATA:  Dyspnea on exertion. Possible chronic obstructive pulmonary disease. EXAM: CHEST - 2 VIEW COMPARISON:  Radiographs of September 22, 2016. FINDINGS: The heart size and mediastinal contours are within normal limits. Both lungs are clear. No pneumothorax or pleural effusion is noted. The visualized skeletal  structures are unremarkable. IMPRESSION: No active cardiopulmonary disease. Electronically Signed   By: Marijo Conception M.D.   On: 05/20/2019 12:17      Specialty Problems      Pulmonary Problems   Exertional dyspnea      No Known Allergies  Immunization History  Administered Date(s) Administered   Zoster Recombinat (Shingrix) 07/11/2017, 12/25/2017   Counseled patient on his need and recommendation for patient receive Pneumovax 23 as well as high-dose flu vaccine today.  Patient declined.  Patient reports that he will receive this from his primary care provider.  Past Medical History:  Diagnosis Date   Arthritis    GERD (gastroesophageal reflux disease)    Hep C w/o coma, chronic ([facility])    Hyperlipidemia    Hypothyroidism    Panhypopituitarism ([facility])     Tobacco History: Social History   Tobacco Use  Smoking Status Current Every Day Smoker   Packs/day: 0.60   Years: 45.00   Pack years: 27.00   Types: Cigarettes  Smokeless Tobacco Never Used  Tobacco Comment   quit from [place], first 20 years smoked 1 ppd, Now: 3-4 cigs a day    Ready to quit: No Counseling given: Yes Comment: quit from [place], first 20 years smoked 1 ppd, Now: 3-4 cigs a day   Smoking assessment and cessation counseling  Patient currently smoking: 3-4 cigarettes  I have advised the patient to quit/stop smoking as soon as possible due to high risk  for multiple medical problems.  It will also be very difficult for [place] to manage patient's  respiratory symptoms and status if we continue to expose her lungs to a known irritant.  We do not advise e-cigarettes as a form of stopping smoking.  Patient is willing to quit smoking.  Has not set quit date yet.  I have advised the patient that we can assist and have options of nicotine replacement therapy, provided smoking cessation education today, provided smoking cessation counseling, and provided cessation resources.  Patient reports he  has had success with nicotine replacement gum.  He is using the 2 mg dose.  He does not need a cigarette within the first 30 minutes of waking up.  Educated patient on the correct way of chewing nicotine replacement gum.  Patient reports he was not doing this accurately.  Prescription provided today for 2 mg Nicorette gum.  Patient does not like using nicotine patches.  Follow-up next office visit office visit for assessment of smoking cessation.  Smoking cessation counseling advised for: 12 min    Outpatient Encounter Medications as of 05/20/2019  Medication Sig   albuterol (PROVENTIL HFA;VENTOLIN HFA) 108 (90 BASE) MCG/ACT inhaler Inhale 2 puffs into the lungs every 6 (six) hours as needed for wheezing or shortness of breath.    ANDRODERM 4 MG/24HR PT24 patch Apply 1 patch topically daily. Uses a 4 mg and 2 mg patch together to equal 6 mg daily   aspirin EC 81 MG tablet Take 81 mg by mouth daily.   atorvastatin (LIPITOR) 10 MG tablet Take 10 mg by mouth daily.    CIALIS 5 MG tablet Take 5 mg by mouth daily as needed for erectile dysfunction.    HYDROcodone-acetaminophen (NORCO/VICODIN) 5-325 MG tablet Take 1-2 tablets by mouth every 4 (four) hours as needed (pain).   hydrocortisone (ANUSOL-HC) 2.5 % rectal cream APPLY TO AFFECTED AREA TWICE A DAY   hydrocortisone (CORTEF) 10 MG tablet Take 5 mg by mouth daily.    hydrOXYzine (ATARAX/VISTARIL) 10 MG tablet Take 1 tablet (10 mg total) by mouth 3 (three) times daily.   levothyroxine (SYNTHROID, LEVOTHROID) 88 MCG tablet Take 88 mcg by mouth daily before breakfast.   meloxicam (MOBIC) 15 MG tablet Take 15 mg by mouth daily.    methocarbamol (ROBAXIN) 500 MG tablet Take 1 tablet (500 mg total) by mouth 2 (two) times daily.   naproxen sodium (ANAPROX) 220 MG tablet Take 220 mg by mouth daily as needed (pain).   omeprazole (PRILOSEC) 20 MG capsule Take 20 mg by mouth daily.   ondansetron (ZOFRAN) 4 MG tablet Take 1 tablet (4 mg total)  by mouth every 8 (eight) hours as needed for nausea or vomiting.   RESTASIS 0.05 % ophthalmic emulsion INSTILL 1 DROP INTO BOTH EYES TWICE A DAY   Testosterone (ANDRODERM) 2 MG/24HR PT24 Place 2 mg onto the skin daily. Uses a 2 mg and 4 mg patch together daily to equal 6 mg   vitamin B-12 (CYANOCOBALAMIN) 500 MCG tablet Take 500 mcg by mouth daily.   [DISCONTINUED] nicotine (NICODERM CQ - DOSED IN MG/24 HR) 7 mg/24hr patch Place 1 patch (7 mg total) daily onto the skin.   nicotine polacrilex (NICORETTE) 2 MG gum Take 1 each (2 mg total) by mouth as needed for smoking cessation.   [DISCONTINUED] cephALEXin (KEFLEX) 500 MG capsule Take 1 capsule (500 mg total) by mouth 4 (four) times daily.   No facility-administered encounter medications on file as of 05/20/2019.  Review of Systems  Review of Systems  Constitutional: Negative for activity change, chills, fatigue, fever and unexpected weight change.  HENT: Negative for rhinorrhea, sinus pressure, sinus pain and sore throat.   Eyes: Negative.   Respiratory: Negative for cough, shortness of breath and wheezing.   Cardiovascular: Negative for chest pain and palpitations.  Gastrointestinal: Negative for constipation, diarrhea, nausea and vomiting.  Endocrine: Negative.   Genitourinary: Negative.   Musculoskeletal: Negative.   Skin: Negative.   Neurological: Negative for dizziness and headaches.  Psychiatric/Behavioral: Negative.  Negative for dysphoric mood. The patient is not nervous/anxious.   All other systems reviewed and are negative.    Physical Exam  BP 108/60    Pulse 64    Temp 98.2 F (36.8 C) (Oral)    Ht 5\' 9"  (1.753 m)    Wt 155 lb (70.3 kg)    SpO2 98%    BMI 22.89 kg/m   Wt Readings from Last 5 Encounters:  05/20/19 155 lb (70.3 kg)  06/22/18 151 lb (68.5 kg)  07/26/17 158 lb (71.7 kg)  07/10/17 158 lb (71.7 kg)  06/26/17 158 lb 12.8 oz (72 kg)     Physical Exam Vitals signs and nursing note reviewed.    Constitutional:      General: He is not in acute distress.    Appearance: Normal appearance. He is obese.  HENT:     Head: Normocephalic and atraumatic.     Right Ear: Hearing, tympanic membrane, ear canal and external ear normal.     Left Ear: Hearing, tympanic membrane, ear canal and external ear normal.     Nose: Nose normal. No mucosal edema or rhinorrhea.     Right Turbinates: Not enlarged.     Left Turbinates: Not enlarged.     Mouth/Throat:     Mouth: Mucous membranes are dry.     Dentition: Abnormal dentition.     Pharynx: Oropharynx is clear. No oropharyngeal exudate.  Eyes:     Pupils: Pupils are equal, round, and reactive to light.  Neck:     Musculoskeletal: Normal range of motion.  Cardiovascular:     Rate and Rhythm: Normal rate and regular rhythm.     Pulses: Normal pulses.     Heart sounds: Normal heart sounds. No murmur.  Pulmonary:     Effort: Pulmonary effort is normal.     Breath sounds: Normal breath sounds. No decreased breath sounds, wheezing or rales.  Musculoskeletal:     Right lower leg: No edema.     Left lower leg: No edema.  Lymphadenopathy:     Cervical: No cervical adenopathy.  Skin:    General: Skin is warm and dry.     Capillary Refill: Capillary refill takes less than 2 seconds.     Findings: No erythema or rash.  Neurological:     General: No focal deficit present.     Mental Status: He is alert and oriented to person, place, and time.     Motor: No weakness.     Coordination: Coordination normal.     Gait: Gait is intact. Gait normal.  Psychiatric:        Mood and Affect: Mood normal.        Behavior: Behavior normal. Behavior is cooperative.        Thought Content: Thought content normal.        Judgment: Judgment normal.      Lab Results:  CBC    Component Value Date/Time  WBC 11.1 (H) 01/22/2017 0835   RBC 4.85 01/22/2017 0835   HGB 15.1 01/22/2017 0835   HCT 43.6 01/22/2017 0835   PLT 211 01/22/2017 0835   MCV 89.9  01/22/2017 0835   MCH 31.1 01/22/2017 0835   MCHC 34.6 01/22/2017 0835   RDW 13.7 01/22/2017 0835   LYMPHSABS 2.9 09/05/2015 0610   MONOABS 0.5 09/05/2015 0610   EOSABS 0.3 09/05/2015 0610   BASOSABS 0.0 09/05/2015 0610    BMET    Component Value Date/Time   NA 141 01/22/2017 0835   K 4.0 01/22/2017 0835   CL 107 01/22/2017 0835   CO2 27 01/22/2017 0835   GLUCOSE 106 (H) 01/22/2017 0835   BUN 14 01/22/2017 0835   CREATININE 0.88 01/22/2017 0835   CREATININE 0.69 02/09/2011 1355   CALCIUM 9.3 01/22/2017 0835   GFRNONAA >60 01/22/2017 0835   GFRAA >60 01/22/2017 0835    BNP No results found for: BNP  ProBNP No results found for: PROBNP    Assessment & Plan:   Exertional dyspnea Suspect patient may have aspect of COPD due to smoking history as well as occasional symptoms of bronchitis  Plan: Chest x-ray today Continue rescue inhaler as needed for shortness of breath and wheezing We will coordinate and schedule pulmonary function testing and a follow-up with our office in 6 to 8 weeks   Tobacco abuse Plan: Continue to work on stopping smoking You need to set a quit date Prescription for 2 mg Nicorette gum provided today Counseled patient on need for high-dose flu vaccine as well as Pneumovax 23, patient declined he reports he will receive this from his primary care provider Pulmonary function testing ordered Follow-up with our office in 6 to 8 weeks   Healthcare maintenance Plan: Offered high-dose flu vaccine and Pneumovax 23 today in office, patient declined    Return in about 6 weeks (around 07/01/2019), or if symptoms worsen or fail to improve, for Follow up with Wyn Quaker FNP-C, Follow up with Dr. Elsworth Soho, Follow up for PFT.   Lauraine Rinne, NP 05/20/2019   This appointment was 26 minutes long with over 50% of the time in direct face-to-face patient care, assessment, plan of care, and follow-up.

## 2019-05-20 ENCOUNTER — Other Ambulatory Visit: Payer: Self-pay

## 2019-05-20 ENCOUNTER — Telehealth: Payer: Self-pay | Admitting: Pulmonary Disease

## 2019-05-20 ENCOUNTER — Encounter: Payer: Self-pay | Admitting: Pulmonary Disease

## 2019-05-20 ENCOUNTER — Ambulatory Visit (INDEPENDENT_AMBULATORY_CARE_PROVIDER_SITE_OTHER): Payer: Medicare Other

## 2019-05-20 ENCOUNTER — Ambulatory Visit (INDEPENDENT_AMBULATORY_CARE_PROVIDER_SITE_OTHER): Payer: Medicare Other | Admitting: Pulmonary Disease

## 2019-05-20 VITALS — BP 108/60 | HR 64 | Temp 98.2°F | Ht 69.0 in | Wt 155.0 lb

## 2019-05-20 DIAGNOSIS — Z720 Tobacco use: Secondary | ICD-10-CM | POA: Diagnosis not present

## 2019-05-20 DIAGNOSIS — R0609 Other forms of dyspnea: Secondary | ICD-10-CM

## 2019-05-20 DIAGNOSIS — Z0000 Encounter for general adult medical examination without abnormal findings: Secondary | ICD-10-CM | POA: Insufficient documentation

## 2019-05-20 DIAGNOSIS — F17210 Nicotine dependence, cigarettes, uncomplicated: Secondary | ICD-10-CM

## 2019-05-20 MED ORDER — NICOTINE POLACRILEX 2 MG MT GUM: 2.0000 mg | CHEWING_GUM | OROMUCOSAL | 2 refills | Status: DC | PRN

## 2019-05-20 NOTE — Assessment & Plan Note (Signed)
Plan: Continue to work on stopping smoking You need to set a quit date Prescription for 2 mg Nicorette gum provided today Counseled patient on need for high-dose flu vaccine as well as Pneumovax 23, patient declined he reports he will receive this from his primary care provider Pulmonary function testing ordered Follow-up with our office in 6 to 8 weeks

## 2019-05-20 NOTE — Progress Notes (Signed)
Your chest x-ray results of come back.  Showing no acute changes.  No plan of care changes at this time.  Keep follow-up appointment.    Follow-up with our office if symptoms worsen or you do not feel like you are improving under her current regimen.  It was a pleasure taking care of you,  Brian Mack, FNP 

## 2019-05-20 NOTE — Assessment & Plan Note (Signed)
Suspect patient may have aspect of COPD due to smoking history as well as occasional symptoms of bronchitis  Plan: Chest x-ray today Continue rescue inhaler as needed for shortness of breath and wheezing We will coordinate and schedule pulmonary function testing and a follow-up with our office in 6 to 8 weeks

## 2019-05-20 NOTE — Assessment & Plan Note (Signed)
Plan: Offered high-dose flu vaccine and Pneumovax 23 today in office, patient declined

## 2019-05-20 NOTE — Patient Instructions (Signed)
You were seen today by Lauraine Rinne, NP  for: \  1. Exertional dyspnea  I suspect you likely have emphysema and potentially COPD, we will order a breathing test to further evaluate.  - Pulmonary function test; Future - DG Chest 2 View; Future  2. Tobacco abuse  We recommend that you stop smoking.  >>>You need to set a quit date >>>If you have friends or family who smoke, let them know you are trying to quit and not to smoke around you or in your living environment  Smoking Cessation Resources:  1 800 QUIT NOW  >>> Patient to call this resource and utilize it to help support her quit smoking >>> Keep up your hard work with stopping smoking  You can also contact the [facility] >>>For smoking cessation classes call [phone]  We do not recommend using e-cigarettes as a form of stopping smoking  You can sign up for smoking cessation support texts and information:  >>>https://smokefree.gov/smokefreetxt   Nicotine Gum:  >>>Smokers who smoke fewer than 25 cigarettes a day should use 2 mg dose  Proper chewing of gum is important for optimal results.   >>>Do not chew gum to rapidly.  Once you start chewing eating tasty peppery taste and slide the gum to your cheek.  When the taste disappears to a few more times.  Repeat this for 30 minutes.  Then discard the gum.  >>>Avoid acidic beverages such as coffee, carbonated beverages before and during gum use.  A soft acidic beverages lower oral pH which cause nicotine to not be absorbed properly >>>If you chew the gum too quickly or vigorously you could have nausea, vomiting, abdominal pain, constipation, hiccups, headache, sore jaw, mouth irritation ulcers  - Pulmonary function test; Future - nicotine polacrilex (NICORETTE) 2 MG gum; Take 1 each (2 mg total) by mouth as needed for smoking cessation.  Dispense: 100 tablet; Refill: 2 - DG Chest 2 View; Future  3. Healthcare maintenance  We recommend that you receive the  Pneumovax 23 which is a pneumonia vaccine as well as the high-dose flu vaccine today, you declined this You stated that you will receive these from your primary care provider   We recommend today:  Orders Placed This Encounter  Procedures   DG Chest 2 View    Standing Status:   Future    Standing Expiration Date:   07/19/2020    Order Specific Question:   Reason for Exam (SYMPTOM  OR DIAGNOSIS REQUIRED)    Answer:   tobacco use, suspected copd    Order Specific Question:   Preferred imaging location?    Answer:   Internal    Order Specific Question:   Radiology Contrast Protocol - do NOT remove file path    Answer:   [email or web address]   Pulmonary function test    Standing Status:   Future    Standing Expiration Date:   05/19/2020    Order Specific Question:   Where should this test be performed?    Answer:   [facility]    Order Specific Question:   Full PFT: includes the following: basic spirometry, spirometry pre & post bronchodilator, diffusion capacity (DLCO), lung volumes    Answer:   Full PFT   Orders Placed This Encounter  Procedures   DG Chest 2 View   Pulmonary function test   Meds ordered this encounter  Medications   nicotine polacrilex (NICORETTE) 2 MG gum    Sig: Take 1 each (  2 mg total) by mouth as needed for smoking cessation.    Dispense:  100 tablet    Refill:  2    Follow Up:    Return in about 6 weeks (around 07/01/2019), or if symptoms worsen or fail to improve, for Follow up with Wyn Quaker FNP-C, Follow up with Dr. Elsworth Soho, Follow up for PFT.   Please do your part to reduce the spread of COVID-19:      Reduce your risk of any infection  and COVID19 by using the similar precautions used for avoiding the common cold or flu:   Wash your hands often with soap and warm water for at least 20 seconds.  If soap and water are not readily available, use an alcohol-based hand sanitizer with at least 60%  alcohol.   If coughing or sneezing, cover your mouth and nose by coughing or sneezing into the elbow areas of your shirt or coat, into a tissue or into your sleeve (not your hands).  WEAR A MASK when in public   Avoid shaking hands with others and consider head nods or verbal greetings only.  Avoid touching your eyes, nose, or mouth with unwashed hands.   Avoid close contact with people who are sick.  Avoid places or events with large numbers of people in one location, like concerts or sporting events.  If you have some symptoms but not all symptoms, continue to monitor at home and seek medical attention if your symptoms worsen.  If you are having a medical emergency, call 911.   [place] / e-Visit: [email or web address]         [facility]: [facility]: [id number]                   [facility]: [id number]     It is flu season:   >>> Best ways to protect herself from the flu: Receive the yearly flu vaccine, practice good hand hygiene washing with soap and also using hand sanitizer when available, eat a nutritious meals, get adequate rest, hydrate appropriately   Please contact the office if your symptoms worsen or you have concerns that you are not improving.   Thank you for choosing [facility] for your healthcare, and for allowing [place] to partner with you on your healthcare journey. I am thankful to be able to provide care to you today.   Wyn Quaker FNP-C    Health Risks of Smoking Smoking cigarettes is very bad for your health. Tobacco smoke has over 200 known poisons in it. It contains the poisonous gases nitrogen oxide and carbon monoxide. There are over 60 chemicals in tobacco smoke that cause cancer. Smoking is difficult to quit because a chemical in tobacco, called nicotine, causes addiction or  dependence. When you smoke and inhale, nicotine is absorbed rapidly into the bloodstream through your lungs. Both inhaled and non-inhaled nicotine may be addictive. What are the risks of cigarette smoke? Cigarette smokers have an increased risk of many serious medical problems, including:  Lung cancer.  Lung disease, such as pneumonia, bronchitis, and emphysema.  Chest pain (angina) and heart attack because the heart is not getting enough oxygen.  Heart disease and peripheral blood vessel disease.  High blood pressure (hypertension).  Stroke.  Oral cancer, including cancer of the lip, mouth, or voice box.  Bladder cancer.  Pancreatic cancer.  Cervical cancer.  Pregnancy complications, including premature  birth.  Beola Cord and smaller newborn babies, birth defects, and genetic damage to sperm.  Early menopause.  Lower estrogen level for women.  Infertility.  Facial wrinkles.  Blindness.  Increased risk of broken bones (fractures).  Senile dementia.  Stomach ulcers and internal bleeding.  Delayed wound healing and increased risk of complications during surgery.  Even smoking lightly shortens your life expectancy by several years. Because of secondhand smoke exposure, children of smokers have an increased risk of the following:  Sudden infant death syndrome (SIDS).  Respiratory infections.  Lung cancer.  Heart disease.  Ear infections. What are the benefits of quitting? There are many health benefits of quitting smoking. Here are some of them:  Within days of quitting smoking, your risk of having a heart attack decreases, your blood flow improves, and your lung capacity improves. Blood pressure, pulse rate, and breathing patterns start returning to normal soon after quitting.  Within months, your lungs may clear up completely.  Quitting for 10 years reduces your risk of developing lung cancer and heart disease to almost that of a nonsmoker.  People  who quit may see an improvement in their overall quality of life. How do I quit smoking?     Smoking is an addiction with both physical and psychological effects, and longtime habits can be hard to change. Your health care provider can recommend:  Programs and community resources, which may include group support, education, or talk therapy.  Prescription medicines to help reduce cravings.  Nicotine replacement products, such as patches, gum, and nasal sprays. Use these products only as directed. Do not replace cigarette smoking with electronic cigarettes, which are commonly called e-cigarettes. The safety of e-cigarettes is not known, and some may contain harmful chemicals.  A combination of two or more of these methods. Where to find more information  American Lung Association: www.lung.org  American Cancer Society: www.cancer.org Summary  Smoking cigarettes is very bad for your health. Cigarette smokers have an increased risk of many serious medical problems, including several cancers, heart disease, and stroke.  Smoking is an addiction with both physical and psychological effects, and longtime habits can be hard to change.  By stopping right away, you can greatly reduce the risk of medical problems for you and your family.  To help you quit smoking, your health care provider can recommend programs, community resources, prescription medicines, and nicotine replacement products such as patches, gum, and nasal sprays. This information is not intended to replace advice given to you by your health care provider. Make sure you discuss any questions you have with your health care provider. Document Released: 10/12/2004 Document Revised: 12/06/2017 Document Reviewed: 09/08/2016 Elsevier Patient Education  2020 [employer].

## 2019-05-20 NOTE — Telephone Encounter (Signed)
Patient was seen by Aaron Edelman today. He has requested the patient to follow up with RA in 6 weeks with a PFT.   Will forward to [facility] for follow up for the PFT.

## 2019-05-22 NOTE — Telephone Encounter (Signed)
Scheduled pft on 10/13- f/u made with Rodney Ayala since RA doesn't have a schedule available for Oct -pr

## 2019-06-26 ENCOUNTER — Other Ambulatory Visit: Payer: Self-pay | Admitting: Pulmonary Disease

## 2019-06-27 ENCOUNTER — Other Ambulatory Visit (HOSPITAL_COMMUNITY)
Admission: RE | Admit: 2019-06-27 | Discharge: 2019-06-27 | Disposition: A | Discharge status: Home / Self Care | Payer: Medicare Other | Source: Ambulatory Visit | Attending: Pulmonary Disease | Admitting: Pulmonary Disease

## 2019-06-27 DIAGNOSIS — Z01812 Encounter for preprocedural laboratory examination: Secondary | ICD-10-CM | POA: Insufficient documentation

## 2019-06-27 DIAGNOSIS — Z20828 Contact with and (suspected) exposure to other viral communicable diseases: Secondary | ICD-10-CM | POA: Diagnosis not present

## 2019-06-30 LAB — NOVEL CORONAVIRUS, NAA (HOSP ORDER, SEND-OUT TO REF LAB; TAT 18-24 HRS): SARS-CoV-2, NAA: NOT DETECTED

## 2019-06-30 NOTE — Progress Notes (Signed)
@Patient  ID: Rodney Ayala, male    DOB: [birth year], 68 y.o.   MRN: [id number]  Chief Complaint  Patient presents with  . Follow-up    F/U after PFT. States his breathing has been ok since last visit. Will receive his flu vaccine.     Referring provider: Leeroy Cha  HPI:  68 year old male current smoker initially referred to our office in 2018 for exertional dyspnea  Smoker/ Smoking History: Current smoker.  27-pack-year smoking history. Maintenance:  None  Pt of: Dr. Elsworth Soho  07/01/2019  - Visit   68 year old male current every day smoker presenting to our office today for pulmonary function test.  Patient is followed in our office for dyspnea on exertion as well as suspected COPD.  Pulmonary function test listed below:  07/01/2019-pulmonary function test-FVC 3.47 (101% predicted), postbronchodilator ratio 82, postbronchodilator FEV1 2.79 (108% predicted), no bronchodilator response, mid flow reversibility, DLCO 18.58 (78% predicted)  Patient is not on any sort of maintenance inhalers.  He reports he was previously started on maintenance inhalers many years ago by primary care but then another primary care provider took him off of this.  He has a rescue inhaler on his med list but he does not actually have one at home.  Overall patient feels that his shortness of breath and dyspnea is stable.  mMRC 1 today.  He does report episodes of increased shortness of breath, wheezing as well as occasional chest tightness when he is mowing the lawn at his daughter's house which has a pretty significant incline.  He reports no shortness of breath when mowing his own lawn which is a flat surface.  Unfortunately, the patient does continue to smoke.  He is smoking 3 to 4 cigarettes a day.  He reports he has not set a quit date yet and has not started using his nicotine replacement gum that was prescribed at last office visit due to additional family stressors as well as family's health  concerns over the last 2 months.  He reports that he is interested in stopping smoking and is willing to try this in the future.    Questionaires / Pulmonary Flowsheets:   MMRC: mMRC Dyspnea Scale mMRC Score  07/01/2019 1    Tests:   09/05/2015-CBC with differential- eosinophils relative 4, eosinophils absolute 0.3  07/26/2017-spirometry- FVC 2.8 (80% predicted), ratio 76, FEV1 2.1 (79% predicted)  07/01/2019-pulmonary function test-FVC 3.47 (101% predicted), postbronchodilator ratio 82, postbronchodilator FEV1 2.79 (108% predicted), no bronchodilator response, mid flow reversibility, DLCO 18.58 (78% predicted)  FENO:  No results found for: NITRICOXIDE  PFT: PFT Results Latest Ref Rng & Units 07/01/2019  FVC-Pre L 3.47  FVC-Predicted Pre % 101  FVC-Post L 3.41  FVC-Predicted Post % 100  Pre FEV1/FVC % % 76  Post FEV1/FCV % % 82  FEV1-Pre L 2.62  FEV1-Predicted Pre % 102  FEV1-Post L 2.79  DLCO UNC% % 78  DLCO COR %Predicted % 84  TLC L 5.65  TLC % Predicted % 87  RV % Predicted % 99    WALK:  SIX MIN WALK 07/01/2019  Supplimental Oxygen during Test? (L/min) No  Tech Comments: Patient was able to complete 2 laps without stopping. No O2 was needed during walk.    Imaging: No results found.  Lab Results:  CBC    Component Value Date/Time   WBC 11.1 (H) 01/22/2017 0835   RBC 4.85 01/22/2017 0835   HGB 15.1 01/22/2017 0835   HCT 43.6 01/22/2017 0835  PLT 211 01/22/2017 0835   MCV 89.9 01/22/2017 0835   MCH 31.1 01/22/2017 0835   MCHC 34.6 01/22/2017 0835   RDW 13.7 01/22/2017 0835   LYMPHSABS 2.9 09/05/2015 0610   MONOABS 0.5 09/05/2015 0610   EOSABS 0.3 09/05/2015 0610   BASOSABS 0.0 09/05/2015 0610    BMET    Component Value Date/Time   NA 141 01/22/2017 0835   K 4.0 01/22/2017 0835   CL 107 01/22/2017 0835   CO2 27 01/22/2017 0835   GLUCOSE 106 (H) 01/22/2017 0835   BUN 14 01/22/2017 0835   CREATININE 0.88 01/22/2017 0835   CREATININE 0.69  02/09/2011 1355   CALCIUM 9.3 01/22/2017 0835   GFRNONAA >60 01/22/2017 0835   GFRAA >60 01/22/2017 0835    BNP No results found for: BNP  ProBNP No results found for: PROBNP  Specialty Problems      Pulmonary Problems   Exertional dyspnea   Asthma-COPD overlap syndrome ([place])    09/05/2015-CBC with differential- eosinophils relative 4, eosinophils absolute 0.3  07/01/2019-pulmonary function test-FVC 3.47 (101% predicted), postbronchodilator ratio 82, postbronchodilator FEV1 2.79 (108% predicted), no bronchodilator response, mid flow reversibility, DLCO 18.58 (78% predicted)          No Known Allergies  Immunization History  Administered Date(s) Administered  . Zoster Recombinat (Shingrix) 07/11/2017, 12/25/2017    Past Medical History:  Diagnosis Date  . Arthritis   . GERD (gastroesophageal reflux disease)   . Hep C w/o coma, chronic ([place])   . Hyperlipidemia   . Hypothyroidism   . Panhypopituitarism ([place])     Tobacco History: Social History   Tobacco Use  Smoking Status Current Every Day Smoker  . Packs/day: 0.60  . Years: 45.00  . Pack years: 27.00  . Types: Cigarettes  Smokeless Tobacco Never Used  Tobacco Comment   quit from [place], first 20 years smoked 1 ppd, Now: 3-4 cigs a day    Ready to quit: Yes Counseling given: Yes Comment: quit from [place], first 20 years smoked 1 ppd, Now: 3-4 cigs a day   Smoking assessment and cessation counseling  Patient currently smoking: 3-4 cigarettes I have advised the patient to quit/stop smoking as soon as possible due to high risk for multiple medical problems.  It will also be very difficult for [place] to manage patient's  respiratory symptoms and status if we continue to expose her lungs to a known irritant.  We do not advise e-cigarettes as a form of stopping smoking.  Patient is willing to quit smoking.  I have advised the patient that we can assist and have options of nicotine replacement therapy,  provided smoking cessation education today, provided smoking cessation counseling, and provided cessation resources. Has NRT gum. Hasnt started.   Follow-up next office visit office visit for assessment of smoking cessation.  Smoking cessation counseling advised for: 5 min    Outpatient Encounter Medications as of 07/01/2019  Medication Sig  . albuterol (PROVENTIL HFA;VENTOLIN HFA) 108 (90 BASE) MCG/ACT inhaler Inhale 2 puffs into the lungs every 6 (six) hours as needed for wheezing or shortness of breath.   Renae Gloss 4 MG/24HR PT24 patch Apply 1 patch topically daily. Uses a 4 mg and 2 mg patch together to equal 6 mg daily  . aspirin EC 81 MG tablet Take 81 mg by mouth daily.  Marland Kitchen atorvastatin (LIPITOR) 10 MG tablet Take 10 mg by mouth daily.   Marland Kitchen CIALIS 5 MG tablet Take 5 mg  by mouth daily as needed for erectile dysfunction.   Marland Kitchen HYDROcodone-acetaminophen (NORCO/VICODIN) 5-325 MG tablet Take 1-2 tablets by mouth every 4 (four) hours as needed (pain).  . hydrocortisone (ANUSOL-HC) 2.5 % rectal cream APPLY TO AFFECTED AREA TWICE A DAY  . hydrocortisone (CORTEF) 10 MG tablet Take 5 mg by mouth daily.   . hydrOXYzine (ATARAX/VISTARIL) 10 MG tablet Take 1 tablet (10 mg total) by mouth 3 (three) times daily.  Marland Kitchen levothyroxine (SYNTHROID, LEVOTHROID) 88 MCG tablet Take 88 mcg by mouth daily before breakfast.  . meloxicam (MOBIC) 15 MG tablet Take 15 mg by mouth daily.   . methocarbamol (ROBAXIN) 500 MG tablet Take 1 tablet (500 mg total) by mouth 2 (two) times daily.  . naproxen sodium (ANAPROX) 220 MG tablet Take 220 mg by mouth daily as needed (pain).  . nicotine polacrilex (NICORETTE) 2 MG gum Take 2 mg by mouth as needed for smoking cessation.  Marland Kitchen omeprazole (PRILOSEC) 20 MG capsule Take 20 mg by mouth daily.  . ondansetron (ZOFRAN) 4 MG tablet Take 1 tablet (4 mg total) by mouth every 8 (eight) hours as needed for nausea or vomiting.  . RESTASIS 0.05 % ophthalmic emulsion INSTILL 1 DROP INTO  BOTH EYES TWICE A DAY  . Testosterone (ANDRODERM) 2 MG/24HR PT24 Place 2 mg onto the skin daily. Uses a 2 mg and 4 mg patch together daily to equal 6 mg  . vitamin B-12 (CYANOCOBALAMIN) 500 MCG tablet Take 500 mcg by mouth daily.  Marland Kitchen albuterol (VENTOLIN HFA) 108 (90 Base) MCG/ACT inhaler Inhale 2 puffs into the lungs every 6 (six) hours as needed for wheezing or shortness of breath.  . budesonide-formoterol (SYMBICORT) 80-4.5 MCG/ACT inhaler Inhale 2 puffs into the lungs 2 (two) times daily.  . [DISCONTINUED] nicotine polacrilex (NICORETTE) 2 MG gum Take 1 each (2 mg total) by mouth as needed for smoking cessation.   No facility-administered encounter medications on file as of 07/01/2019.      Review of Systems  Review of Systems  Constitutional: Negative for activity change, chills, fatigue, fever and unexpected weight change.  HENT: Negative for postnasal drip, rhinorrhea, sinus pressure, sinus pain and sore throat.   Eyes: Negative.   Respiratory: Positive for shortness of breath and wheezing. Negative for cough.   Cardiovascular: Negative for chest pain and palpitations.  Gastrointestinal: Negative for constipation, diarrhea, nausea and vomiting.  Endocrine: Negative.   Genitourinary: Negative.   Musculoskeletal: Negative.   Skin: Negative.   Neurological: Negative for dizziness and headaches.  Psychiatric/Behavioral: Negative.  Negative for dysphoric mood. The patient is not nervous/anxious.   All other systems reviewed and are negative.    Physical Exam  BP 110/68   Pulse 70   Temp 97.7 F (36.5 C) (Temporal)   Ht 5\' 7"  (1.702 m)   Wt 150 lb (68 kg)   SpO2 99%   BMI 23.49 kg/m   Wt Readings from Last 5 Encounters:  07/01/19 150 lb (68 kg)  05/20/19 155 lb (70.3 kg)  06/22/18 151 lb (68.5 kg)  07/26/17 158 lb (71.7 kg)  07/10/17 158 lb (71.7 kg)    BMI Readings from Last 5 Encounters:  07/01/19 23.49 kg/m  05/20/19 22.89 kg/m  06/22/18 22.30 kg/m  07/26/17  24.75 kg/m  07/10/17 24.75 kg/m     Physical Exam Vitals signs and nursing note reviewed.  Constitutional:      General: He is not in acute distress.    Appearance: Normal appearance. He is obese.  HENT:     Head: Normocephalic and atraumatic.     Right Ear: Hearing, tympanic membrane, ear canal and external ear normal. There is no impacted cerumen.     Left Ear: Hearing, tympanic membrane, ear canal and external ear normal. There is no impacted cerumen.     Nose: Nose normal. No mucosal edema, congestion or rhinorrhea.     Right Turbinates: Not enlarged.     Left Turbinates: Not enlarged.     Mouth/Throat:     Mouth: Mucous membranes are dry.     Pharynx: Oropharynx is clear. No oropharyngeal exudate or posterior oropharyngeal erythema.  Eyes:     Pupils: Pupils are equal, round, and reactive to light.  Neck:     Musculoskeletal: Normal range of motion.  Cardiovascular:     Rate and Rhythm: Normal rate and regular rhythm.     Pulses: Normal pulses.     Heart sounds: Normal heart sounds. No murmur.  Pulmonary:     Effort: Pulmonary effort is normal. No respiratory distress.     Breath sounds: Normal breath sounds. No decreased breath sounds, wheezing or rales.  Musculoskeletal:     Right lower leg: No edema.     Left lower leg: No edema.  Lymphadenopathy:     Cervical: No cervical adenopathy.  Skin:    General: Skin is warm and dry.     Capillary Refill: Capillary refill takes less than 2 seconds.     Findings: No erythema or rash.  Neurological:     General: No focal deficit present.     Mental Status: He is alert and oriented to person, place, and time.     Motor: No weakness.     Coordination: Coordination normal.     Gait: Gait is intact. Gait (Tolerated walk in office today.) normal.  Psychiatric:        Mood and Affect: Mood normal.        Behavior: Behavior normal. Behavior is cooperative.        Thought Content: Thought content normal.        Judgment:  Judgment normal.       Assessment & Plan:   Asthma-COPD overlap syndrome (HCC) Plan: Trial of Symbicort 80 Increase daily physical activity Rescue inhaler prescribed today You must stop smoking! Follow-up with our office in 3 months  Healthcare maintenance Plan: Emphasized the need for you to stop smoking Declined flu vaccine Declined pneumonia vaccine  Tobacco abuse Plan: He need to work on stopping smoking You need to set a quit date 2 mg Nicorette gum prescribed at last office visit, patient reports that he pick this up Declined flu vaccine Declined pneumonia vaccine Follow-up with our office in 3 months    Return in about 3 months (around 10/01/2019), or if symptoms worsen or fail to improve, for Follow up with Dr. Elsworth Soho, Follow up with Wyn Quaker FNP-C.   Lauraine Rinne, NP 07/01/2019   This appointment was 28 minutes long with over 50% of the time in direct face-to-face patient care, assessment, plan of care, and follow-up.

## 2019-07-01 ENCOUNTER — Encounter: Payer: Self-pay | Admitting: Pulmonary Disease

## 2019-07-01 ENCOUNTER — Ambulatory Visit (INDEPENDENT_AMBULATORY_CARE_PROVIDER_SITE_OTHER): Payer: Medicare Other | Admitting: Pulmonary Disease

## 2019-07-01 ENCOUNTER — Other Ambulatory Visit: Payer: Self-pay

## 2019-07-01 VITALS — BP 110/68 | HR 70 | Temp 97.7°F | Ht 67.0 in | Wt 150.0 lb

## 2019-07-01 DIAGNOSIS — J449 Chronic obstructive pulmonary disease, unspecified: Secondary | ICD-10-CM

## 2019-07-01 DIAGNOSIS — J4489 Other specified chronic obstructive pulmonary disease: Secondary | ICD-10-CM

## 2019-07-01 DIAGNOSIS — Z720 Tobacco use: Secondary | ICD-10-CM

## 2019-07-01 DIAGNOSIS — R0600 Dyspnea, unspecified: Secondary | ICD-10-CM

## 2019-07-01 DIAGNOSIS — R0609 Other forms of dyspnea: Secondary | ICD-10-CM

## 2019-07-01 DIAGNOSIS — F17210 Nicotine dependence, cigarettes, uncomplicated: Secondary | ICD-10-CM | POA: Diagnosis not present

## 2019-07-01 DIAGNOSIS — Z0000 Encounter for general adult medical examination without abnormal findings: Secondary | ICD-10-CM

## 2019-07-01 LAB — PULMONARY FUNCTION TEST
DL/VA % pred: 84 %
DL/VA: 3.46 ml/min/mmHg/L
DLCO unc % pred: 78 %
DLCO unc: 18.58 ml/min/mmHg
FEF 25-75 Post: 2.75 L/sec
FEF 25-75 Pre: 2 L/sec
FEF2575-%Change-Post: 37 %
FEF2575-%Pred-Post: 122 %
FEF2575-%Pred-Pre: 88 %
FEV1-%Change-Post: 6 %
FEV1-%Pred-Post: 108 %
FEV1-%Pred-Pre: 102 %
FEV1-Post: 2.79 L
FEV1-Pre: 2.62 L
FEV1FVC-%Change-Post: 8 %
FEV1FVC-%Pred-Pre: 98 %
FEV6-%Change-Post: -1 %
FEV6-%Pred-Post: 105 %
FEV6-%Pred-Pre: 106 %
FEV6-Post: 3.41 L
FEV6-Pre: 3.45 L
FEV6FVC-%Change-Post: 0 %
FEV6FVC-%Pred-Post: 104 %
FEV6FVC-%Pred-Pre: 104 %
FVC-%Change-Post: -1 %
FVC-%Pred-Post: 100 %
FVC-%Pred-Pre: 101 %
FVC-Post: 3.41 L
FVC-Pre: 3.47 L
Post FEV1/FVC ratio: 82 %
Post FEV6/FVC ratio: 100 %
Pre FEV1/FVC ratio: 76 %
Pre FEV6/FVC Ratio: 100 %
RV % pred: 99 %
RV: 2.25 L
TLC % pred: 87 %
TLC: 5.65 L

## 2019-07-01 MED ORDER — BUDESONIDE-FORMOTEROL FUMARATE 80-4.5 MCG/ACT IN AERO: 2.0000 | INHALATION_SPRAY | Freq: Two times a day (BID) | RESPIRATORY_TRACT | 0 refills | Status: AC

## 2019-07-01 MED ORDER — ALBUTEROL SULFATE HFA 108 (90 BASE) MCG/ACT IN AERS: 2.0000 | INHALATION_SPRAY | Freq: Four times a day (QID) | RESPIRATORY_TRACT | 4 refills | Status: AC | PRN

## 2019-07-01 NOTE — Assessment & Plan Note (Signed)
Plan: He need to work on stopping smoking You need to set a quit date 2 mg Nicorette gum prescribed at last office visit, patient reports that he pick this up Declined flu vaccine Declined pneumonia vaccine Follow-up with our office in 3 months

## 2019-07-01 NOTE — Assessment & Plan Note (Signed)
Plan: Trial of Symbicort 80 Increase daily physical activity Rescue inhaler prescribed today You must stop smoking! Follow-up with our office in 3 months

## 2019-07-01 NOTE — Patient Instructions (Addendum)
You were seen today by Lauraine Rinne, NP  for:   1. Asthma-COPD overlap syndrome (HCC)  Trial of Symbicort 80 >>> 2 puffs in the morning right when you wake up, rinse out your mouth after use, 12 hours later 2 puffs, rinse after use >>> Take this daily, no matter what >>> This is not a rescue inhaler   Contact our office when you finish the first April we have provided and let us know how you are doing on this medication.  If you are tolerating it well then we will send in a prescription.  If for some reason this medication is very expensive when you go to the pharmacy please contact our office so that way we can follow-up and see which medication would be the best fit for you.  We will also send a prescription for a rescue inhaler which you can use every 4-6 hours as needed for shortness of breath or wheezing.  I would recommend utilizing the rescue inhaler prior to mowing the lawn.  Only use your albuterol as a rescue medication to be used if you can't catch your breath by resting or doing a relaxed purse lip breathing pattern.  - The less you use it, the better it will work when you need it. - Ok to use up to 2 puffs  every 4 hours if you must but call for immediate appointment if use goes up over your usual need - Don't leave home without it !!  (think of it like the spare tire for your car)    2. Tobacco abuse  Once again we emphasized that you need to stop smoking.  Start using the nicotine gum that was prescribed at last office visit.  Notify our office if you have any sort of concerns or if you are having difficulty quitting.  You need to set a quit date!  We recommend that you stop smoking.  >>>You need to set a quit date >>>If you have friends or family who smoke, let them know you are trying to quit and not to smoke around you or in your living environment  Smoking Cessation Resources:  1 800 QUIT NOW  >>> Patient to call this resource and utilize it to help support her  quit smoking >>> Keep up your hard work with stopping smoking  You can also contact the [facility] >>>For smoking cessation classes call [phone]  We do not recommend using e-cigarettes as a form of stopping smoking  You can sign up for smoking cessation support texts and information:  >>>https://smokefree.gov/smokefreetxt  3. Healthcare maintenance  Please stop smoking  Continue to increase daily physical activity   We recommend today:  No orders of the defined types were placed in this encounter.  No orders of the defined types were placed in this encounter.  Meds ordered this encounter  Medications  . budesonide-formoterol (SYMBICORT) 80-4.5 MCG/ACT inhaler    Sig: Inhale 2 puffs into the lungs 2 (two) times daily.    Dispense:  2 Inhaler    Refill:  0    Order Specific Question:   Lot Number?    Answer:   [id number]    Order Specific Question:   Expiration Date?    Answer:   05/18/2020    Order Specific Question:   Manufacturer?    Answer:   [employer] [[id number]]    Follow Up:    Return in about 3 months (around 10/01/2019), or if symptoms worsen or fail  to improve, for Follow up with Dr. Elsworth Soho, Follow up with Wyn Quaker FNP-C.   Please do your part to reduce the spread of COVID-19:      Reduce your risk of any infection  and COVID19 by using the similar precautions used for avoiding the common cold or flu:  Marland Kitchen Wash your hands often with soap and warm water for at least 20 seconds.  If soap and water are not readily available, use an alcohol-based hand sanitizer with at least 60% alcohol.  . If coughing or sneezing, cover your mouth and nose by coughing or sneezing into the elbow areas of your shirt or coat, into a tissue or into your sleeve (not your hands). Langley Gauss A MASK when in public  . Avoid shaking hands with others and consider head nods or verbal greetings only. . Avoid touching your eyes, nose, or mouth with unwashed hands.  . Avoid close  contact with people who are sick. . Avoid places or events with large numbers of people in one location, like concerts or sporting events. . If you have some symptoms but not all symptoms, continue to monitor at home and seek medical attention if your symptoms worsen. . If you are having a medical emergency, call 911.   [place] / e-Visit: [email or web address]         [facility]: [facility]: [id number]                   [facility]: [id number]     It is flu season:   >>> Best ways to protect herself from the flu: Receive the yearly flu vaccine, practice good hand hygiene washing with soap and also using hand sanitizer when available, eat a nutritious meals, get adequate rest, hydrate appropriately   Please contact the office if your symptoms worsen or you have concerns that you are not improving.   Thank you for choosing [facility] for your healthcare, and for allowing [place] to partner with you on your healthcare journey. I am thankful to be able to provide care to you today.   Wyn Quaker FNP-C    Health Risks of Smoking Smoking cigarettes is very bad for your health. Tobacco smoke has over 200 known poisons in it. It contains the poisonous gases nitrogen oxide and carbon monoxide. There are over 60 chemicals in tobacco smoke that cause cancer. Smoking is difficult to quit because a chemical in tobacco, called nicotine, causes addiction or dependence. When you smoke and inhale, nicotine is absorbed rapidly into the bloodstream through your lungs. Both inhaled and non-inhaled nicotine may be addictive. What are the risks of cigarette smoke? Cigarette smokers have an increased risk of many serious medical problems, including:  Lung cancer.  Lung disease, such as pneumonia, bronchitis, and emphysema.  Chest pain  (angina) and heart attack because the heart is not getting enough oxygen.  Heart disease and peripheral blood vessel disease.  High blood pressure (hypertension).  Stroke.  Oral cancer, including cancer of the lip, mouth, or voice box.  Bladder cancer.  Pancreatic cancer.  Cervical cancer.  Pregnancy complications, including premature birth.  Stillbirths and smaller newborn babies, birth defects, and genetic damage to sperm.  Early menopause.  Lower estrogen level for women.  Infertility.  Facial wrinkles.  Blindness.  Increased risk of broken bones (fractures).  Senile dementia.  Stomach ulcers and internal bleeding.  Delayed  wound healing and increased risk of complications during surgery.  Even smoking lightly shortens your life expectancy by several years. Because of secondhand smoke exposure, children of smokers have an increased risk of the following:  Sudden infant death syndrome (SIDS).  Respiratory infections.  Lung cancer.  Heart disease.  Ear infections. What are the benefits of quitting? There are many health benefits of quitting smoking. Here are some of them:  Within days of quitting smoking, your risk of having a heart attack decreases, your blood flow improves, and your lung capacity improves. Blood pressure, pulse rate, and breathing patterns start returning to normal soon after quitting.  Within months, your lungs may clear up completely.  Quitting for 10 years reduces your risk of developing lung cancer and heart disease to almost that of a nonsmoker.  People who quit may see an improvement in their overall quality of life. How do I quit smoking?     Smoking is an addiction with both physical and psychological effects, and longtime habits can be hard to change. Your health care provider can recommend:  Programs and community resources, which may include group support, education, or talk therapy.  Prescription medicines to help  reduce cravings.  Nicotine replacement products, such as patches, gum, and nasal sprays. Use these products only as directed. Do not replace cigarette smoking with electronic cigarettes, which are commonly called e-cigarettes. The safety of e-cigarettes is not known, and some may contain harmful chemicals.  A combination of two or more of these methods. Where to find more information  American Lung Association: www.lung.org  American Cancer Society: www.cancer.org Summary  Smoking cigarettes is very bad for your health. Cigarette smokers have an increased risk of many serious medical problems, including several cancers, heart disease, and stroke.  Smoking is an addiction with both physical and psychological effects, and longtime habits can be hard to change.  By stopping right away, you can greatly reduce the risk of medical problems for you and your family.  To help you quit smoking, your health care provider can recommend programs, community resources, prescription medicines, and nicotine replacement products such as patches, gum, and nasal sprays. This information is not intended to replace advice given to you by your health care provider. Make sure you discuss any questions you have with your health care provider. Document Released: 10/12/2004 Document Revised: 12/06/2017 Document Reviewed: 09/08/2016 Elsevier Patient Education  2020 [employer].

## 2019-07-01 NOTE — Progress Notes (Signed)
Discussed in office today.  Nothing further needed.  Patient to be trialed on Symbicort 80.  Patient to work on stopping smoking.  Wyn Quaker, FNP

## 2019-07-01 NOTE — Progress Notes (Signed)
PFT done today. 

## 2019-07-01 NOTE — Assessment & Plan Note (Signed)
Plan: Emphasized the need for you to stop smoking Declined flu vaccine Declined pneumonia vaccine

## 2019-07-02 ENCOUNTER — Telehealth: Payer: Self-pay | Admitting: Pulmonary Disease

## 2019-07-02 NOTE — Telephone Encounter (Signed)
Tammy with pt's PCP Dr. Saunders Revel called requesting copy of PFT and covid test on behalf of patient. Per request, I have faxed these records to the number provided [phone]. I have received the confirmation at 1:59 PM EDT. Nothing further needed at this time.

## 2019-09-30 NOTE — Progress Notes (Deleted)
@Patient  ID: Rodney Ayala, male    DOB: Aug 03, 1951, 69 y.o.   MRN: [id number]  No chief complaint on file.   Referring provider: Leeroy Cha  HPI:  69 year old male current smoker initially referred to our office in 2018 for exertional dyspnea  PMH:  Smoker/ Smoking History: Current smoker.  27-pack-year smoking history. Maintenance:  None  Pt of: Dr. Elsworth Soho  09/30/2019  - Visit     Questionaires / Pulmonary Flowsheets:   ACT:  No flowsheet data found.  MMRC: mMRC Dyspnea Scale mMRC Score  07/01/2019 1    [place]:  No flowsheet data found.  Tests:   09/05/2015-CBC with differential- eosinophils relative 4, eosinophils absolute 0.3  07/26/2017-spirometry- FVC 2.8 (80% predicted), ratio 76, FEV1 2.1 (79% predicted)  07/01/2019-pulmonary function test-FVC 3.47 (101% predicted), postbronchodilator ratio 82, postbronchodilator FEV1 2.79 (108% predicted), no bronchodilator response, mid flow reversibility, DLCO 18.58 (78% predicted)  FENO:  No results found for: NITRICOXIDE  PFT: PFT Results Latest Ref Rng & Units 07/01/2019  FVC-Pre L 3.47  FVC-Predicted Pre % 101  FVC-Post L 3.41  FVC-Predicted Post % 100  Pre FEV1/FVC % % 76  Post FEV1/FCV % % 82  FEV1-Pre L 2.62  FEV1-Predicted Pre % 102  FEV1-Post L 2.79  DLCO UNC% % 78  DLCO COR %Predicted % 84  TLC L 5.65  TLC % Predicted % 87  RV % Predicted % 99    WALK:  SIX MIN WALK 07/01/2019  Supplimental Oxygen during Test? (L/min) No  Tech Comments: Patient was able to complete 2 laps without stopping. No O2 was needed during walk.    Imaging: No results found.  Lab Results:  CBC    Component Value Date/Time   WBC 11.1 (H) 01/22/2017 0835   RBC 4.85 01/22/2017 0835   HGB 15.1 01/22/2017 0835   HCT 43.6 01/22/2017 0835   PLT 211 01/22/2017 0835   MCV 89.9 01/22/2017 0835   MCH 31.1 01/22/2017 0835   MCHC 34.6 01/22/2017 0835   RDW 13.7 01/22/2017 0835   LYMPHSABS 2.9 09/05/2015  0610   MONOABS 0.5 09/05/2015 0610   EOSABS 0.3 09/05/2015 0610   BASOSABS 0.0 09/05/2015 0610    BMET    Component Value Date/Time   NA 141 01/22/2017 0835   K 4.0 01/22/2017 0835   CL 107 01/22/2017 0835   CO2 27 01/22/2017 0835   GLUCOSE 106 (H) 01/22/2017 0835   BUN 14 01/22/2017 0835   CREATININE 0.88 01/22/2017 0835   CREATININE 0.69 02/09/2011 1355   CALCIUM 9.3 01/22/2017 0835   GFRNONAA >60 01/22/2017 0835   GFRAA >60 01/22/2017 0835    BNP No results found for: BNP  ProBNP No results found for: PROBNP  Specialty Problems      Pulmonary Problems   Exertional dyspnea   Asthma-COPD overlap syndrome ([place])    09/05/2015-CBC with differential- eosinophils relative 4, eosinophils absolute 0.3  07/01/2019-pulmonary function test-FVC 3.47 (101% predicted), postbronchodilator ratio 82, postbronchodilator FEV1 2.79 (108% predicted), no bronchodilator response, mid flow reversibility, DLCO 18.58 (78% predicted)          No Known Allergies  Immunization History  Administered Date(s) Administered  . Zoster Recombinat (Shingrix) 07/11/2017, 12/25/2017    Past Medical History:  Diagnosis Date  . Arthritis   . GERD (gastroesophageal reflux disease)   . Hep C w/o coma, chronic ([place])   . Hyperlipidemia   . Hypothyroidism   . Panhypopituitarism ([place])     Tobacco History:  Social History   Tobacco Use  Smoking Status Current Every Day Smoker  . Packs/day: 0.60  . Years: 45.00  . Pack years: 27.00  . Types: Cigarettes  Smokeless Tobacco Never Used  Tobacco Comment   quit from [place], first 20 years smoked 1 ppd, Now: 3-4 cigs a day    Ready to quit: Not Answered Counseling given: Not Answered Comment: quit from [place], first 20 years smoked 1 ppd, Now: 3-4 cigs a day    Continue to not smoke  Outpatient Encounter Medications as of 10/01/2019  Medication Sig  . albuterol (PROVENTIL HFA;VENTOLIN HFA) 108 (90 BASE) MCG/ACT inhaler Inhale 2  puffs into the lungs every 6 (six) hours as needed for wheezing or shortness of breath.   Marland Kitchen albuterol (VENTOLIN HFA) 108 (90 Base) MCG/ACT inhaler Inhale 2 puffs into the lungs every 6 (six) hours as needed for wheezing or shortness of breath.  Renae Gloss 4 MG/24HR PT24 patch Apply 1 patch topically daily. Uses a 4 mg and 2 mg patch together to equal 6 mg daily  . aspirin EC 81 MG tablet Take 81 mg by mouth daily.  Marland Kitchen atorvastatin (LIPITOR) 10 MG tablet Take 10 mg by mouth daily.   . budesonide-formoterol (SYMBICORT) 80-4.5 MCG/ACT inhaler Inhale 2 puffs into the lungs 2 (two) times daily.  Marland Kitchen CIALIS 5 MG tablet Take 5 mg by mouth daily as needed for erectile dysfunction.   Marland Kitchen HYDROcodone-acetaminophen (NORCO/VICODIN) 5-325 MG tablet Take 1-2 tablets by mouth every 4 (four) hours as needed (pain).  . hydrocortisone (ANUSOL-HC) 2.5 % rectal cream APPLY TO AFFECTED AREA TWICE A DAY  . hydrocortisone (CORTEF) 10 MG tablet Take 5 mg by mouth daily.   . hydrOXYzine (ATARAX/VISTARIL) 10 MG tablet Take 1 tablet (10 mg total) by mouth 3 (three) times daily.  Marland Kitchen levothyroxine (SYNTHROID, LEVOTHROID) 88 MCG tablet Take 88 mcg by mouth daily before breakfast.  . meloxicam (MOBIC) 15 MG tablet Take 15 mg by mouth daily.   . methocarbamol (ROBAXIN) 500 MG tablet Take 1 tablet (500 mg total) by mouth 2 (two) times daily.  . naproxen sodium (ANAPROX) 220 MG tablet Take 220 mg by mouth daily as needed (pain).  . nicotine polacrilex (NICORETTE) 2 MG gum Take 2 mg by mouth as needed for smoking cessation.  Marland Kitchen omeprazole (PRILOSEC) 20 MG capsule Take 20 mg by mouth daily.  . ondansetron (ZOFRAN) 4 MG tablet Take 1 tablet (4 mg total) by mouth every 8 (eight) hours as needed for nausea or vomiting.  . RESTASIS 0.05 % ophthalmic emulsion INSTILL 1 DROP INTO BOTH EYES TWICE A DAY  . Testosterone (ANDRODERM) 2 MG/24HR PT24 Place 2 mg onto the skin daily. Uses a 2 mg and 4 mg patch together daily to equal 6 mg  . vitamin  B-12 (CYANOCOBALAMIN) 500 MCG tablet Take 500 mcg by mouth daily.   No facility-administered encounter medications on file as of 10/01/2019.     Review of Systems  Review of Systems   Physical Exam  There were no vitals taken for this visit.  Wt Readings from Last 5 Encounters:  07/01/19 150 lb (68 kg)  05/20/19 155 lb (70.3 kg)  06/22/18 151 lb (68.5 kg)  07/26/17 158 lb (71.7 kg)  07/10/17 158 lb (71.7 kg)    BMI Readings from Last 5 Encounters:  07/01/19 23.49 kg/m  05/20/19 22.89 kg/m  06/22/18 22.30 kg/m  07/26/17 24.75 kg/m  07/10/17 24.75 kg/m  Physical Exam    Assessment & Plan:   No problem-specific Assessment & Plan notes found for this encounter.    No follow-ups on file.   Lauraine Rinne, NP 09/30/2019   This appointment required *** minutes of patient care (this includes precharting, chart review, review of results, face-to-face care, etc.).

## 2019-10-01 ENCOUNTER — Ambulatory Visit: Payer: Medicare Other | Admitting: Pulmonary Disease

## 2019-10-01 ENCOUNTER — Ambulatory Visit (INDEPENDENT_AMBULATORY_CARE_PROVIDER_SITE_OTHER): Payer: Medicare Other | Admitting: Pulmonary Disease

## 2019-10-01 ENCOUNTER — Encounter: Payer: Self-pay | Admitting: Pulmonary Disease

## 2019-10-01 ENCOUNTER — Other Ambulatory Visit: Payer: Self-pay

## 2019-10-01 VITALS — BP 118/72 | HR 65 | Temp 98.4°F | Ht 67.0 in | Wt 155.0 lb

## 2019-10-01 DIAGNOSIS — Z0000 Encounter for general adult medical examination without abnormal findings: Secondary | ICD-10-CM

## 2019-10-01 DIAGNOSIS — J449 Chronic obstructive pulmonary disease, unspecified: Secondary | ICD-10-CM | POA: Diagnosis not present

## 2019-10-01 DIAGNOSIS — F17210 Nicotine dependence, cigarettes, uncomplicated: Secondary | ICD-10-CM | POA: Diagnosis not present

## 2019-10-01 DIAGNOSIS — Z720 Tobacco use: Secondary | ICD-10-CM

## 2019-10-01 NOTE — Assessment & Plan Note (Signed)
Congratulated patient on receiving flu vaccine this year Congratulated patient on receiving Pneumovax 23  Explained to patient that if the Covid vaccine becomes available to him I would recommend that he receives it

## 2019-10-01 NOTE — Progress Notes (Signed)
'@Patient'  ID: Rodney Ayala, male    DOB: 11-Dec-1950, 69 y.o.   MRN: [id number]  Chief Complaint  Patient presents with  . Follow-up    3 month f/u for asthma/copd. States breathing has been the same since last visit. Denies any SOB and coughing increase. Wheezing at last visit has gone away completely.     Referring provider: Leeroy Cha  HPI:  68 year old male current smoker initially referred to our office in 2018 for exertional dyspnea  PMH: Tobacco abuse Smoker/ Smoking History: Current smoker.  27-pack-year smoking history. 3 cigarettes a day Maintenance:  None  Pt of: Dr. Elsworth Soho  10/01/2019  - Visit   69 year old male current everyday smoker presenting for office today as a follow-up visit.  Patient was trialed on Symbicort 80 at last office visit.  He did not find that this helped at all.  He has not been using it.  Patient reports that he is asymptomatic today.  He does continue to smoke.  Smoking around 3 cigarettes a day.  He has received his flu vaccine he is also received his pneumonia vaccine.  Patient has no current complaints right now.  Questionaires / Pulmonary Flowsheets:   MMRC: mMRC Dyspnea Scale mMRC Score  10/01/2019 0  07/01/2019 1    Epworth:  No flowsheet data found.  Tests:   09/05/2015-CBC with differential- eosinophils relative 4, eosinophils absolute 0.3  07/26/2017-spirometry- FVC 2.8 (80% predicted), ratio 76, FEV1 2.1 (79% predicted)  07/01/2019-pulmonary function test-FVC 3.47 (101% predicted), postbronchodilator ratio 82, postbronchodilator FEV1 2.79 (108% predicted), no bronchodilator response, mid flow reversibility, DLCO 18.58 (78% predicted)  FENO:  No results found for: NITRICOXIDE  PFT: PFT Results Latest Ref Rng & Units 07/01/2019  FVC-Pre L 3.47  FVC-Predicted Pre % 101  FVC-Post L 3.41  FVC-Predicted Post % 100  Pre FEV1/FVC % % 76  Post FEV1/FCV % % 82  FEV1-Pre L 2.62  FEV1-Predicted Pre % 102  FEV1-Post L  2.79  DLCO UNC% % 78  DLCO COR %Predicted % 84  TLC L 5.65  TLC % Predicted % 87  RV % Predicted % 99    WALK:  SIX MIN WALK 07/01/2019  Supplimental Oxygen during Test? (L/min) No  Tech Comments: Patient was able to complete 2 laps without stopping. No O2 was needed during walk.    Imaging: No results found.  Lab Results:  CBC    Component Value Date/Time   WBC 11.1 (H) 01/22/2017 0835   RBC 4.85 01/22/2017 0835   HGB 15.1 01/22/2017 0835   HCT 43.6 01/22/2017 0835   PLT 211 01/22/2017 0835   MCV 89.9 01/22/2017 0835   MCH 31.1 01/22/2017 0835   MCHC 34.6 01/22/2017 0835   RDW 13.7 01/22/2017 0835   LYMPHSABS 2.9 09/05/2015 0610   MONOABS 0.5 09/05/2015 0610   EOSABS 0.3 09/05/2015 0610   BASOSABS 0.0 09/05/2015 0610    BMET    Component Value Date/Time   NA 141 01/22/2017 0835   K 4.0 01/22/2017 0835   CL 107 01/22/2017 0835   CO2 27 01/22/2017 0835   GLUCOSE 106 (H) 01/22/2017 0835   BUN 14 01/22/2017 0835   CREATININE 0.88 01/22/2017 0835   CREATININE 0.69 02/09/2011 1355   CALCIUM 9.3 01/22/2017 0835   GFRNONAA >60 01/22/2017 0835   GFRAA >60 01/22/2017 0835    BNP No results found for: BNP  ProBNP No results found for: PROBNP  Specialty Problems      Pulmonary  Problems   Exertional dyspnea   Asthma-COPD overlap syndrome ([place])    09/05/2015-CBC with differential- eosinophils relative 4, eosinophils absolute 0.3  07/01/2019-pulmonary function test-FVC 3.47 (101% predicted), postbronchodilator ratio 82, postbronchodilator FEV1 2.79 (108% predicted), no bronchodilator response, mid flow reversibility, DLCO 18.58 (78% predicted)          No Known Allergies  Immunization History  Administered Date(s) Administered  . Fluad Quad(high Dose 65+) 06/19/2019  . Zoster Recombinat (Shingrix) 07/11/2017, 12/25/2017   Patient reports that he has had a flu as well as a Pneumovax 23 earlier this fall/2020  Past Medical History:  Diagnosis Date   . Arthritis   . GERD (gastroesophageal reflux disease)   . Hep C w/o coma, chronic ([place])   . Hyperlipidemia   . Hypothyroidism   . Panhypopituitarism ([place])     Tobacco History: Social History   Tobacco Use  Smoking Status Current Every Day Smoker  . Packs/day: 0.60  . Years: 45.00  . Pack years: 27.00  . Types: Cigarettes  Smokeless Tobacco Never Used  Tobacco Comment   quit from [place], first 20 years smoked 1 ppd, Now: 3-4 cigs a day    Ready to quit: Yes Counseling given: Yes Comment: quit from [place], first 20 years smoked 1 ppd, Now: 3-4 cigs a day   Smoking assessment and cessation counseling  Patient currently smoking: 3 cigarettes a day I have advised the patient to quit/stop smoking as soon as possible due to high risk for multiple medical problems.  It will also be very difficult for [place] to manage patient's  respiratory symptoms and status if we continue to expose her lungs to a known irritant.  We do not advise e-cigarettes as a form of stopping smoking.  Patient is  willing to quit smoking.  Has not set a quit date, willing to work with clinical pharmacy team  I have advised the patient that we can assist and have options of nicotine replacement therapy, provided smoking cessation education today, provided smoking cessation counseling, and provided cessation resources.  Follow-up next office visit office visit for assessment of smoking cessation.  Smoking cessation counseling advised for: 5 min    Outpatient Encounter Medications as of 10/01/2019  Medication Sig  . albuterol (PROVENTIL HFA;VENTOLIN HFA) 108 (90 BASE) MCG/ACT inhaler Inhale 2 puffs into the lungs every 6 (six) hours as needed for wheezing or shortness of breath.   Marland Kitchen albuterol (VENTOLIN HFA) 108 (90 Base) MCG/ACT inhaler Inhale 2 puffs into the lungs every 6 (six) hours as needed for wheezing or shortness of breath.  Renae Gloss 4 MG/24HR PT24 patch Apply 1 patch topically daily. Uses a  4 mg and 2 mg patch together to equal 6 mg daily  . aspirin EC 81 MG tablet Take 81 mg by mouth daily.  Marland Kitchen atorvastatin (LIPITOR) 10 MG tablet Take 10 mg by mouth daily.   Marland Kitchen CIALIS 5 MG tablet Take 5 mg by mouth daily as needed for erectile dysfunction.   Marland Kitchen HYDROcodone-acetaminophen (NORCO/VICODIN) 5-325 MG tablet Take 1-2 tablets by mouth every 4 (four) hours as needed (pain).  . hydrocortisone (ANUSOL-HC) 2.5 % rectal cream APPLY TO AFFECTED AREA TWICE A DAY  . hydrocortisone (CORTEF) 10 MG tablet Take 5 mg by mouth daily.   . hydrOXYzine (ATARAX/VISTARIL) 10 MG tablet Take 1 tablet (10 mg total) by mouth 3 (three) times daily.  Marland Kitchen levothyroxine (SYNTHROID, LEVOTHROID) 88 MCG tablet Take 88 mcg by mouth daily before  breakfast.  . meloxicam (MOBIC) 15 MG tablet Take 15 mg by mouth daily.   . methocarbamol (ROBAXIN) 500 MG tablet Take 1 tablet (500 mg total) by mouth 2 (two) times daily.  . naproxen sodium (ANAPROX) 220 MG tablet Take 220 mg by mouth daily as needed (pain).  . nicotine polacrilex (NICORETTE) 2 MG gum Take 2 mg by mouth as needed for smoking cessation.  Marland Kitchen omeprazole (PRILOSEC) 20 MG capsule Take 20 mg by mouth daily.  . ondansetron (ZOFRAN) 4 MG tablet Take 1 tablet (4 mg total) by mouth every 8 (eight) hours as needed for nausea or vomiting.  . RESTASIS 0.05 % ophthalmic emulsion INSTILL 1 DROP INTO BOTH EYES TWICE A DAY  . Testosterone (ANDRODERM) 2 MG/24HR PT24 Place 2 mg onto the skin daily. Uses a 2 mg and 4 mg patch together daily to equal 6 mg  . vitamin B-12 (CYANOCOBALAMIN) 500 MCG tablet Take 500 mcg by mouth daily.  Marland Kitchen Zoster Vaccine Adjuvanted (SHINGRIX) injection Shingrix (PF) 50 mcg/0.5 mL intramuscular suspension, kit  TO BE ADMINISTERED BY PHARMACIST FOR IMMUNIZATION  . budesonide-formoterol (SYMBICORT) 80-4.5 MCG/ACT inhaler Inhale 2 puffs into the lungs 2 (two) times daily. (Patient not taking: Reported on 10/01/2019)   No facility-administered encounter  medications on file as of 10/01/2019.     Review of Systems  Review of Systems  Constitutional: Negative for activity change, chills, fatigue, fever and unexpected weight change.  HENT: Negative for postnasal drip, rhinorrhea, sinus pressure, sinus pain and sore throat.   Eyes: Negative.   Respiratory: Negative for cough, shortness of breath and wheezing.   Cardiovascular: Negative for chest pain and palpitations.  Gastrointestinal: Negative for constipation, diarrhea, nausea and vomiting.  Endocrine: Negative.   Genitourinary: Negative.   Musculoskeletal: Negative.   Skin: Negative.   Neurological: Negative for dizziness and headaches.  Psychiatric/Behavioral: Negative.  Negative for dysphoric mood. The patient is not nervous/anxious.   All other systems reviewed and are negative.    Physical Exam  BP 118/72 (BP Location: Left Arm, Patient Position: Sitting, Cuff Size: Normal)   Pulse 65   Temp 98.4 F (36.9 C) (Temporal)   Ht '5\' 7"'  (1.702 m)   Wt 155 lb (70.3 kg)   SpO2 99% Comment: ra  BMI 24.28 kg/m   Wt Readings from Last 5 Encounters:  10/01/19 155 lb (70.3 kg)  07/01/19 150 lb (68 kg)  05/20/19 155 lb (70.3 kg)  06/22/18 151 lb (68.5 kg)  07/26/17 158 lb (71.7 kg)    BMI Readings from Last 5 Encounters:  10/01/19 24.28 kg/m  07/01/19 23.49 kg/m  05/20/19 22.89 kg/m  06/22/18 22.30 kg/m  07/26/17 24.75 kg/m     Physical Exam Vitals and nursing note reviewed.  Constitutional:      General: He is not in acute distress.    Appearance: Normal appearance. He is normal weight.  HENT:     Head: Normocephalic and atraumatic.     Right Ear: Hearing, tympanic membrane, ear canal and external ear normal.     Left Ear: Hearing, tympanic membrane, ear canal and external ear normal.     Nose: Nose normal. No mucosal edema or rhinorrhea.     Right Turbinates: Not enlarged.     Left Turbinates: Not enlarged.     Mouth/Throat:     Mouth: Mucous membranes are  dry.     Pharynx: Oropharynx is clear. No oropharyngeal exudate.  Eyes:     Pupils: Pupils are equal, round,  and reactive to light.  Cardiovascular:     Rate and Rhythm: Normal rate and regular rhythm.     Pulses: Normal pulses.     Heart sounds: Normal heart sounds. No murmur.  Pulmonary:     Effort: Pulmonary effort is normal.     Breath sounds: Normal breath sounds. No decreased breath sounds, wheezing or rales.  Musculoskeletal:     Cervical back: Normal range of motion.     Right lower leg: No edema.     Left lower leg: No edema.  Lymphadenopathy:     Cervical: No cervical adenopathy.  Skin:    General: Skin is warm and dry.     Capillary Refill: Capillary refill takes less than 2 seconds.     Findings: No erythema or rash.  Neurological:     General: No focal deficit present.     Mental Status: He is alert and oriented to person, place, and time.     Motor: No weakness.     Coordination: Coordination normal.     Gait: Gait is intact. Gait normal.  Psychiatric:        Mood and Affect: Mood normal.        Behavior: Behavior normal. Behavior is cooperative.        Thought Content: Thought content normal.        Judgment: Judgment normal.       Assessment & Plan:   Asthma-COPD overlap syndrome ([place]) Plan: Okay to remain off Symbicort 80 Continue to increase physical activity Emphasized again the patient needs to stop smoking We will coordinate follow-up with the clinical pharmacy team regarding smoking cessation Follow-up in 3 months  Healthcare maintenance Congratulated patient on receiving flu vaccine this year Congratulated patient on receiving Pneumovax 23  Explained to patient that if the Covid vaccine becomes available to him I would recommend that he receives it  Tobacco abuse Plan: Emphasized need for the patient to stop smoking We will coordinate follow-up with the clinical pharmacy team regarding smoking cessation    Return in about 3 months  (around 12/30/2019), or if symptoms worsen or fail to improve, for Follow up with Dr. Elsworth Soho.   Lauraine Rinne, NP 10/01/2019   This appointment required 24 minutes of patient care (this includes precharting, chart review, review of results, face-to-face care, etc.).

## 2019-10-01 NOTE — Assessment & Plan Note (Signed)
Plan: Okay to remain off Symbicort 80 Continue to increase physical activity Emphasized again the patient needs to stop smoking We will coordinate follow-up with the clinical pharmacy team regarding smoking cessation Follow-up in 3 months

## 2019-10-01 NOTE — Patient Instructions (Addendum)
You were seen today by Lauraine Rinne, NP  for:   1. Asthma-COPD overlap syndrome ([place])  Okay to remain off Symbicort 80  Continue to recommend that you need to stop smoking  2. Tobacco abuse  We recommend that you stop smoking.  >>>You need to set a quit date >>>If you have friends or family who smoke, let them know you are trying to quit and not to smoke around you or in your living environment  Smoking Cessation Resources:  1 800 QUIT NOW  >>> Patient to call this resource and utilize it to help support her quit smoking >>> Keep up your hard work with stopping smoking  You can also contact the [facility] >>>For smoking cessation classes call [phone]  We do not recommend using e-cigarettes as a form of stopping smoking  You can sign up for smoking cessation support texts and information:  >>>https://smokefree.gov/smokefreetxt  Please present to our office in 2 weeks for an appointment with the clinical pharmacy team for:  . Smoking cessation   3. Healthcare maintenance  COVID-19 Vaccine Information can be found at: ShippingScam.co.uk For questions related to vaccine distribution or appointments, please email vaccine@[email or web address] .[email or web address] or call [phone].    Follow Up:    Return in about 3 months (around 12/30/2019), or if symptoms worsen or fail to improve, for Follow up with Dr. Elsworth Soho.  Please present to our office in 2 weeks for an appointment with the clinical pharmacy team for:  . Smoking cessation  Please do your part to reduce the spread of COVID-19:      Reduce your risk of any infection  and COVID19 by using the similar precautions used for avoiding the common cold or flu:  Marland Kitchen Wash your hands often with soap and warm water for at least 20 seconds.  If soap and water are not readily available, use an alcohol-based hand sanitizer with at least 60% alcohol.  . If coughing or sneezing,  cover your mouth and nose by coughing or sneezing into the elbow areas of your shirt or coat, into a tissue or into your sleeve (not your hands). Langley Gauss A MASK when in public  . Avoid shaking hands with others and consider head nods or verbal greetings only. . Avoid touching your eyes, nose, or mouth with unwashed hands.  . Avoid close contact with people who are sick. . Avoid places or events with large numbers of people in one location, like concerts or sporting events. . If you have some symptoms but not all symptoms, continue to monitor at home and seek medical attention if your symptoms worsen. . If you are having a medical emergency, call 911.   [place] / e-Visit: [email or web address]         [facility]: [facility]: [id number]                   [facility]: [id number]     It is flu season:   >>> Best ways to protect herself from the flu: Receive the yearly flu vaccine, practice good hand hygiene washing with soap and also using hand sanitizer when available, eat a nutritious meals, get adequate rest, hydrate appropriately   Please contact the office if your symptoms worsen or you have concerns that you are not improving.   Thank you for choosing [facility] for your healthcare, and for allowing [place] to  partner with you on your healthcare journey. I am thankful to be able to provide care to you today.   Wyn Quaker FNP-C

## 2019-10-01 NOTE — Assessment & Plan Note (Signed)
Plan: Emphasized need for the patient to stop smoking We will coordinate follow-up with the clinical pharmacy team regarding smoking cessation

## 2019-10-15 ENCOUNTER — Ambulatory Visit: Payer: Medicare Other

## 2019-10-15 NOTE — Progress Notes (Deleted)
Subjective Patient presents to [facility] and seen by the pharmacist for smoking cessation counseling.   Patient was referred and last seen by  Wyn Quaker, FNP on 10/01/2019.      Social History   Tobacco Use  Smoking Status Current Every Day Smoker  . Packs/day: 0.60  . Years: 45.00  . Pack years: 27.00  . Types: Cigarettes  Smokeless Tobacco Never Used  Tobacco Comment   quit from [place], first 20 years smoked 1 ppd, Now: 3-4 cigs a day      Tobacco Use History  Age when started using tobacco on a daily basis ***.  Type: {Nicotine:3044014::"cigarettes","cigar","pipe","electronic cigarettes","snus"}.  Number of cigarettes per day ***, brand ***.  Smokes first cigarette *** minutes after waking.  {Does/does not:3044014::"Does","Does not"} wake at night to smoke  Triggers include {hx nicotine triggers:311123}.  Quit Attempt History   Most recent quit attempt ***.  Longest time ever been tobacco free ***.  Methods tried in the past include {CHL AMB PCMH MEDICATIONS FOR SMOKING CESSATION:20759}.   Rates IMPORTANCE of quitting tobacco on 1-10 scale of ***.  Rates READINESS of quitting tobacco on 1-10 scale of ***.  Rates CONFIDENCE of quitting tobacco on 1-10 scale of ***.  Motivators to quitting include ***; barriers include {smoking cessation barriers:18118}    Immunization History  Administered Date(s) Administered  . Fluad Quad(high Dose 65+) 06/19/2019  . Zoster Recombinat (Shingrix) 07/11/2017, 12/25/2017     Assessment and Plan  1. Smoking Cessation   Reviewed STAR Quit Plan.   S-set quit date: *** T-tell everyone: *** A-anticipate challenges: ***. Patient picked 3 alternative activities to try instead of smoking a cigarette 1) *** 2) *** 3) *** R- remove all tobacco products: patient will trash cigarettes, lighters, and ash trays by *** -Initiated nicotine replacement tx with ***. Patient counseled on purpose, proper use, and  potential adverse effects.   Nicotine Patch Patient counseled on purpose, proper use, and potential adverse effects, including mild itching or redness at the point of application, headache, trouble sleeping, and/or vivid dreams     Patch Schedule for >10 cigarettes daily -Weeks 1-6: one 21 mg patch daily -Weeks 7-8: one 14 mg patch daily -Weeks 9-10: one 7 mg patch daily  Patch Schedule for <10 cigarettes daily -Weeks 1 to 6: one nicotine patch (14 mg) daily. I will call and re-assess how you are doing at the end of 6 weeks to see how you are doing on 14 mg patch and if you are ready to decrease dose of patch. -Weeks 7-8: one nicotine patch (7 mg) daily  Nicotine Lozenge Patient counseled on purpose, proper use, and potential adverse effects including nausea, hiccups, cough, and heartburn.  Instructed patient to use  2 mg unless the smoke within 30 minutes of waking up in which they should use 4 mg.  Lozenge dosing schedule Weeks 1 to 6: 1 lozenge every 1 to 2 hours (maximum: 5 lozenges every 6 hours; 20 lozenges/day); to increase chances of quitting, use at least 9 lozenges/day during the first 6 weeks   Weeks 7 to 9: 1 lozenge every 2 to 4 hours (maximum: 5 lozenges every 6 hours; 20 lozenges/day)   Weeks 10 to 12: 1 lozenge every 4 to 8 hours (maximum: 5 lozenges every 6 hours; 20 lozenges/day)   Nicotine Gum Patient counseled on purpose, proper use, and potential adverse effects including jaw soreness and upset stomach if swallowing saliva.  Instructed patient to use 4  mg if they smoke a pack a day or more and 2 mg if they smoke less than a pack a day.  Gum dosing schedule Weeks 1 to 6: Chew 1 piece of gum every 1 to 2 hours (maximum: 24 pieces/day); to increase chances of quitting, chew at least 9 pieces/day during the first 6 weeks   Weeks 7 to 9: Chew 1 piece of gum every 2 to 4 hours (maximum: 24 pieces/day)   Weeks 10 to 12: Chew 1 piece of gum every 4 to 8 hours (maximum: 24  pieces/day)  Bupropion -Initiated bupropion ***. Patient with no PMH of seizures. Patient counseled on purpose, proper use, and potential adverse effects, including insomnia, and potential change in mood.   Varenicline -Initiated varenicline titration of 0.5 mg by mouth once daily with food x3 days, then 0.5 mg by mouth twice daily with food x4 days, then 1 mg by mouth twice daily with food thereafter.  CrCL greater than 30 mL/min. Patient counseled on purpose, proper use, and potential adverse effects, including GI upset, and potential change in mood.   -Provided information on 1 800-QUIT NOW support program.   2. Medication Reconciliation A drug regimen assessment was performed, including review of allergies, interactions, disease-state management, dosing and immunization history. Medications were reviewed with the patient, including name, instructions, indication, goals of therapy, potential side effects, importance of adherence, and safe use.  3. Immunizations Patient is indicated for influenzae, pneumonia, and shingles vaccinations.

## 2020-10-28 DIAGNOSIS — J449 Chronic obstructive pulmonary disease, unspecified: Secondary | ICD-10-CM | POA: Diagnosis not present

## 2020-10-28 DIAGNOSIS — J0110 Acute frontal sinusitis, unspecified: Secondary | ICD-10-CM | POA: Diagnosis not present

## 2020-12-01 DIAGNOSIS — Z79899 Other long term (current) drug therapy: Secondary | ICD-10-CM | POA: Diagnosis not present

## 2020-12-01 DIAGNOSIS — D7589 Other specified diseases of blood and blood-forming organs: Secondary | ICD-10-CM | POA: Diagnosis not present

## 2020-12-01 DIAGNOSIS — E230 Hypopituitarism: Secondary | ICD-10-CM | POA: Diagnosis not present

## 2020-12-01 DIAGNOSIS — E785 Hyperlipidemia, unspecified: Secondary | ICD-10-CM | POA: Diagnosis not present

## 2020-12-03 DIAGNOSIS — E230 Hypopituitarism: Secondary | ICD-10-CM | POA: Diagnosis not present

## 2021-02-16 DIAGNOSIS — H02834 Dermatochalasis of left upper eyelid: Secondary | ICD-10-CM | POA: Diagnosis not present

## 2021-02-16 DIAGNOSIS — H40023 Open angle with borderline findings, high risk, bilateral: Secondary | ICD-10-CM | POA: Diagnosis not present

## 2021-02-16 DIAGNOSIS — H02831 Dermatochalasis of right upper eyelid: Secondary | ICD-10-CM | POA: Diagnosis not present

## 2021-02-16 DIAGNOSIS — H16223 Keratoconjunctivitis sicca, not specified as Sjogren's, bilateral: Secondary | ICD-10-CM | POA: Diagnosis not present

## 2021-02-16 DIAGNOSIS — H35033 Hypertensive retinopathy, bilateral: Secondary | ICD-10-CM | POA: Diagnosis not present

## 2021-02-16 DIAGNOSIS — H2513 Age-related nuclear cataract, bilateral: Secondary | ICD-10-CM | POA: Diagnosis not present

## 2021-05-19 DIAGNOSIS — M7989 Other specified soft tissue disorders: Secondary | ICD-10-CM | POA: Diagnosis not present

## 2021-05-27 DIAGNOSIS — M7989 Other specified soft tissue disorders: Secondary | ICD-10-CM | POA: Diagnosis not present

## 2021-05-27 DIAGNOSIS — R600 Localized edema: Secondary | ICD-10-CM | POA: Diagnosis not present

## 2021-06-13 DIAGNOSIS — E230 Hypopituitarism: Secondary | ICD-10-CM | POA: Diagnosis not present

## 2021-06-16 ENCOUNTER — Other Ambulatory Visit: Payer: Self-pay | Admitting: Internal Medicine

## 2021-06-16 DIAGNOSIS — D352 Benign neoplasm of pituitary gland: Secondary | ICD-10-CM

## 2021-07-04 ENCOUNTER — Other Ambulatory Visit: Payer: Self-pay

## 2021-07-04 ENCOUNTER — Ambulatory Visit
Admission: RE | Admit: 2021-07-04 | Discharge: 2021-07-04 | Disposition: A | Discharge status: Home / Self Care | Payer: Medicare Other | Source: Ambulatory Visit | Attending: Internal Medicine | Admitting: Internal Medicine

## 2021-07-04 DIAGNOSIS — E237 Disorder of pituitary gland, unspecified: Secondary | ICD-10-CM | POA: Diagnosis not present

## 2021-07-04 DIAGNOSIS — R9082 White matter disease, unspecified: Secondary | ICD-10-CM | POA: Diagnosis not present

## 2021-07-04 DIAGNOSIS — D352 Benign neoplasm of pituitary gland: Secondary | ICD-10-CM

## 2021-07-04 MED ORDER — GADOBENATE DIMEGLUMINE 529 MG/ML IV SOLN
6.0000 mL | Freq: Once | INTRAVENOUS | Status: AC | PRN
  Administered 2021-07-04: 6 mL via INTRAVENOUS

## 2021-07-13 DIAGNOSIS — Z7189 Other specified counseling: Secondary | ICD-10-CM | POA: Diagnosis not present

## 2021-07-13 DIAGNOSIS — D7589 Other specified diseases of blood and blood-forming organs: Secondary | ICD-10-CM | POA: Diagnosis not present

## 2021-07-13 DIAGNOSIS — E785 Hyperlipidemia, unspecified: Secondary | ICD-10-CM | POA: Diagnosis not present

## 2021-07-13 DIAGNOSIS — E230 Hypopituitarism: Secondary | ICD-10-CM | POA: Diagnosis not present

## 2021-07-13 DIAGNOSIS — Z1389 Encounter for screening for other disorder: Secondary | ICD-10-CM | POA: Diagnosis not present

## 2021-07-13 DIAGNOSIS — Z0000 Encounter for general adult medical examination without abnormal findings: Secondary | ICD-10-CM | POA: Diagnosis not present

## 2021-07-13 DIAGNOSIS — Z23 Encounter for immunization: Secondary | ICD-10-CM | POA: Diagnosis not present

## 2021-07-13 DIAGNOSIS — J449 Chronic obstructive pulmonary disease, unspecified: Secondary | ICD-10-CM | POA: Diagnosis not present

## 2021-07-13 DIAGNOSIS — Z86010 Personal history of colonic polyps: Secondary | ICD-10-CM | POA: Diagnosis not present

## 2021-07-13 DIAGNOSIS — K219 Gastro-esophageal reflux disease without esophagitis: Secondary | ICD-10-CM | POA: Diagnosis not present

## 2021-08-18 ENCOUNTER — Ambulatory Visit
Admission: RE | Admit: 2021-08-18 | Discharge: 2021-08-18 | Disposition: A | Discharge status: Home / Self Care | Payer: Medicare Other | Source: Ambulatory Visit | Attending: Internal Medicine | Admitting: Internal Medicine

## 2021-08-18 ENCOUNTER — Other Ambulatory Visit: Payer: Self-pay | Admitting: Internal Medicine

## 2021-08-18 DIAGNOSIS — F17200 Nicotine dependence, unspecified, uncomplicated: Secondary | ICD-10-CM | POA: Diagnosis not present

## 2021-08-18 DIAGNOSIS — M1611 Unilateral primary osteoarthritis, right hip: Secondary | ICD-10-CM | POA: Diagnosis not present

## 2021-08-18 DIAGNOSIS — M25551 Pain in right hip: Secondary | ICD-10-CM

## 2021-08-18 DIAGNOSIS — M47816 Spondylosis without myelopathy or radiculopathy, lumbar region: Secondary | ICD-10-CM

## 2021-08-18 DIAGNOSIS — M545 Low back pain, unspecified: Secondary | ICD-10-CM | POA: Diagnosis not present

## 2021-08-18 DIAGNOSIS — K219 Gastro-esophageal reflux disease without esophagitis: Secondary | ICD-10-CM | POA: Diagnosis not present

## 2021-08-18 DIAGNOSIS — G8929 Other chronic pain: Secondary | ICD-10-CM | POA: Diagnosis not present

## 2021-08-29 ENCOUNTER — Encounter (HOSPITAL_COMMUNITY): Payer: Self-pay | Admitting: Emergency Medicine

## 2021-08-29 ENCOUNTER — Ambulatory Visit (HOSPITAL_COMMUNITY)
Admission: EM | Admit: 2021-08-29 | Discharge: 2021-08-29 | Disposition: A | Discharge status: Home / Self Care | Payer: Medicare Other | Attending: Family Medicine | Admitting: Family Medicine

## 2021-08-29 ENCOUNTER — Other Ambulatory Visit: Payer: Self-pay

## 2021-08-29 DIAGNOSIS — R519 Headache, unspecified: Secondary | ICD-10-CM

## 2021-08-29 NOTE — ED Provider Notes (Signed)
[place]    CSN: [id number] Arrival date & time: 08/29/21  8242      History   Chief Complaint No chief complaint on file.   HPI Rodney Ayala is a 70 y.o. male.   HPI Here for h/o h/a and loss of balance and dizziness that began suddenly on 12/9.   Did not fall.   The h/a continued through the next 2 days, and into this AM. Tylenol did not help but he took goody powder this AM, and his head feels much better. The head is not hurting now. He had had a flu shot on 12/5.  This AM when he got up his left arm spasmed up, drew up to his torso. He pushed on the arm and it resolved within seconds.  The dizziness was a vertigo. The vertigo improved when the h/a got better. No dysarthria/dysphasia, and no one sided muscle weakness  PMH: has a pituitary tumor, followed by endo.  Maybe has some copd  Uncertain if had fever this weekend. Had one episode of feeling very warm, and then would get cold. Did have some chills. Had some rhinorrhea and some cough. Took some otc meds, and congestion/cough has quit as of 12/10.  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   Tobacco abuse 05/20/2019   Healthcare maintenance 05/20/2019   Exertional dyspnea 07/26/2017   Lumbar stenosis 01/25/2017    Past Surgical History:  Procedure Laterality Date   COLONOSCOPY     15 + yrs ago at [facility] 04/10/2013   Procedure: CRANIOTOMY HYPOPHYSECTOMY TRANSNASAL APPROACH;  Surgeon: Hosie Spangle, MD;  Location: [facility];  Service: Neurosurgery;  Laterality: N/A;  Transphenoidal resection of pituitary tumor   LUMBAR LAMINECTOMY/DECOMPRESSION MICRODISCECTOMY N/A 01/25/2017   Procedure: Lumbar three-lumbar four   Lumbar Laminectomy;  Surgeon: [name], MD;  Location: [facility];  Service: Neurosurgery;  Laterality: N/A;   TRANSNASAL APPROACH N/A 04/10/2013   Procedure: TRANSNASAL APPROACH;  Surgeon: [name], MD;  Location: [facility];  Service: ENT;  Laterality: N/A;       Home Medications    Prior to Admission medications   Medication Sig Start Date End Date Taking? Authorizing Provider  HYDROcodone-acetaminophen (NORCO/VICODIN) 5-325 MG tablet Take 1-2 tablets by mouth every 4 (four) hours as needed (pain). 12/05/17  Yes Evelina Bucy, DPM  levothyroxine (SYNTHROID, LEVOTHROID) 88 MCG tablet Take 88 mcg by mouth daily before breakfast.   Yes [provider]  omeprazole (PRILOSEC) 20 MG capsule Take 20 mg by mouth daily.   Yes [provider]  Testosterone (ANDRODERM) 2 MG/24HR PT24 Place 2 mg onto the skin daily. Uses a 2 mg and 4 mg patch together daily to equal 6 mg   Yes [provider]  albuterol (PROVENTIL HFA;VENTOLIN HFA) 108 (90 BASE) MCG/ACT inhaler Inhale 2 puffs into the lungs every 6 (six) hours as needed for wheezing or shortness of breath.     [provider]  albuterol (VENTOLIN HFA) 108 (90 Base) MCG/ACT inhaler Inhale 2 puffs into the lungs every 6 (six) hours as needed for wheezing or shortness of breath. 07/01/19   Lauraine Rinne,  NP  ANDRODERM 4 MG/24HR PT24 patch Apply 1 patch topically daily. Uses a 4 mg and 2 mg patch together to equal 6 mg daily 08/24/15   [provider]  aspirin EC 81 MG tablet Take 81 mg by mouth daily.    [provider]  atorvastatin (LIPITOR) 10 MG tablet Take 10 mg by mouth daily.  08/28/16   [provider]  budesonide-formoterol (SYMBICORT) 80-4.5 MCG/ACT inhaler Inhale 2 puffs into the lungs 2 (two) times daily. Patient not taking: Reported on 10/01/2019 07/01/19   Lauraine Rinne, NP  CIALIS 5 MG tablet Take 5 mg by mouth daily as needed for erectile dysfunction.  08/28/16    [provider]  hydrocortisone (ANUSOL-HC) 2.5 % rectal cream APPLY TO AFFECTED AREA TWICE A DAY 04/15/19   [provider]  hydrocortisone (CORTEF) 10 MG tablet Take 5 mg by mouth daily.     [provider]  hydrOXYzine (ATARAX/VISTARIL) 10 MG tablet Take 1 tablet (10 mg total) by mouth 3 (three) times daily. 06/22/18   McDonald, Mia A, PA-C  meloxicam (MOBIC) 15 MG tablet Take 15 mg by mouth daily.  Patient not taking: Reported on 08/29/2021 08/31/16   [provider]  methocarbamol (ROBAXIN) 500 MG tablet Take 1 tablet (500 mg total) by mouth 2 (two) times daily. 04/06/17   [name], PA-C  naproxen sodium (ANAPROX) 220 MG tablet Take 220 mg by mouth daily as needed (pain). Patient not taking: Reported on 08/29/2021    [provider]  nicotine polacrilex (NICORETTE) 2 MG gum Take 2 mg by mouth as needed for smoking cessation.    [provider]  ondansetron (ZOFRAN) 4 MG tablet Take 1 tablet (4 mg total) by mouth every 8 (eight) hours as needed for nausea or vomiting. 10/10/17   Evelina Bucy, DPM  RESTASIS 0.05 % ophthalmic emulsion INSTILL 1 DROP INTO BOTH EYES TWICE A DAY 01/15/18   [provider]  vitamin B-12 (CYANOCOBALAMIN) 500 MCG tablet Take 500 mcg by mouth daily.    [provider]  Zoster Vaccine Adjuvanted [facility]) injection Shingrix (PF) 50 mcg/0.5 mL intramuscular suspension, kit  TO BE ADMINISTERED BY PHARMACIST FOR IMMUNIZATION    [provider]    Family History Family History  Problem Relation Age of Onset   Cancer Mother    Colon cancer Neg Hx    Colon polyps Neg Hx    Esophageal cancer Neg Hx    Rectal cancer Neg Hx    Stomach cancer Neg Hx     Social History Social History   Tobacco Use   Smoking status: Every Day    Packs/day: 0.60    Years: 45.00    Pack years: 27.00    Types: Cigarettes   Smokeless tobacco: Never   Tobacco comments:    quit from [place], first  20 years smoked 1 ppd, Now: 3-4 cigs a day   Vaping Use   Vaping Use: Never used  Substance Use Topics   Alcohol use: No    Comment: 15 yrs- no ETOH now per pt    Drug use: No    Types: "Crack" cocaine, Cocaine, Marijuana    Comment: 15 yrs     Allergies   Patient has no known allergies.   Review of Systems Review of Systems   Physical Exam Triage Vital Signs ED Triage Vitals  Enc Vitals Group     BP 08/29/21 0957 120/65  Pulse Rate 08/29/21 0957 (!) 54     Resp 08/29/21 0957 20     Temp 08/29/21 0957 98.3 F (36.8 C)     Temp Source 08/29/21 0957 Oral     SpO2 08/29/21 0957 97 %     Weight --      Height --      Head Circumference --      Peak Flow --      Pain Score 08/29/21 0953 0     Pain Loc --      Pain Edu? --      Excl. in [place]? --    No data found.  Updated Vital Signs BP 120/65 (BP Location: Left Arm)   Pulse (!) 54   Temp 98.3 F (36.8 C) (Oral)   Resp 20   SpO2 97%   Visual Acuity Right Eye Distance:   Left Eye Distance:   Bilateral Distance:    Right Eye Near:   Left Eye Near:    Bilateral Near:     Physical Exam Vitals reviewed.  Constitutional:      General: He is not in acute distress.    Appearance: He is not ill-appearing, toxic-appearing or diaphoretic.  HENT:     Head: Atraumatic.     Right Ear: Tympanic membrane and ear canal normal.     Left Ear: Tympanic membrane and ear canal normal.     Nose: Nose normal.     Mouth/Throat:     Mouth: Mucous membranes are moist.     Pharynx: No oropharyngeal exudate or posterior oropharyngeal erythema.  Eyes:     Extraocular Movements: Extraocular movements intact.     Conjunctiva/sclera: Conjunctivae normal.     Pupils: Pupils are equal, round, and reactive to light.     Comments: No papilledema. No nystagmus  Cardiovascular:     Rate and Rhythm: Normal rate and regular rhythm.     Heart sounds: No murmur heard. Pulmonary:     Effort: Pulmonary effort is normal. No respiratory  distress.     Breath sounds: No stridor. No wheezing or rhonchi.  Musculoskeletal:     Cervical back: Neck supple.     Right lower leg: No edema.     Left lower leg: No edema.  Lymphadenopathy:     Cervical: No cervical adenopathy.  Skin:    Capillary Refill: Capillary refill takes less than 2 seconds.     Coloration: Skin is not jaundiced or pale.  Neurological:     General: No focal deficit present.     Mental Status: He is alert and oriented to person, place, and time.     Cranial Nerves: No cranial nerve deficit.     Sensory: No sensory deficit.     Motor: No weakness.     Gait: Gait normal.     Deep Tendon Reflexes: Reflexes normal.     Comments: Finger to nose nl  Psychiatric:        Behavior: Behavior normal.     UC Treatments / Results  Labs (all labs ordered are listed, but only abnormal results are displayed) Labs Reviewed - No data to display  EKG   Radiology No results found.  Procedures Procedures (including critical care time)  Medications Ordered in UC Medications - No data to display  Initial Impression / Assessment and Plan / UC Course  I have reviewed the triage vital signs and the nursing notes.  Pertinent labs & imaging results that were available during  my care of the patient were reviewed by me and considered in my medical decision making (see chart for details).     With his feeling better and all symptoms resolved, will do no further eval at this time. He will follow up with PCP and endo, and will to go the ER if h/a or vertigo return Final Clinical Impressions(s) / UC Diagnoses   Final diagnoses:  Nonintractable headache, unspecified chronicity pattern, unspecified headache type     Discharge Instructions      I suspect you had a little viral illness causing your symptoms, and I am glad the symptoms have improved.   If this h/a or dizziness return soon, though, go to the ER, where they can do urgent imaging to address your  symptoms.  Also let your endocrinologist know this happened     ED Prescriptions   None    PDMP not reviewed this encounter.   Barrett Henle, MD 08/29/21 1041

## 2021-08-29 NOTE — ED Triage Notes (Signed)
08/26/2021 is when symptoms started.  Patient reports having headache, body aches, weakness, initially had a cough.  Yesterday, runny nose and headache.  Today, patient has taken a goody powder around 8 am and head is not hurting.   Patient reports this morning , left arm drew up, close to body.  Reportedly he used right arm to bring left back to his side.  Lasting for " a hot second"  No complaints at this moment

## 2021-08-29 NOTE — Discharge Instructions (Addendum)
I suspect you had a little viral illness causing your symptoms, and I am glad the symptoms have improved.   If this h/a or dizziness return soon, though, go to the ER, where they can do urgent imaging to address your symptoms.  Also let your endocrinologist know this happened

## 2021-10-14 DIAGNOSIS — H40023 Open angle with borderline findings, high risk, bilateral: Secondary | ICD-10-CM | POA: Diagnosis not present

## 2021-10-14 DIAGNOSIS — H35033 Hypertensive retinopathy, bilateral: Secondary | ICD-10-CM | POA: Diagnosis not present

## 2021-10-14 DIAGNOSIS — H16223 Keratoconjunctivitis sicca, not specified as Sjogren's, bilateral: Secondary | ICD-10-CM | POA: Diagnosis not present

## 2021-10-14 DIAGNOSIS — H02834 Dermatochalasis of left upper eyelid: Secondary | ICD-10-CM | POA: Diagnosis not present

## 2021-10-14 DIAGNOSIS — H02831 Dermatochalasis of right upper eyelid: Secondary | ICD-10-CM | POA: Diagnosis not present

## 2021-12-07 DIAGNOSIS — D352 Benign neoplasm of pituitary gland: Secondary | ICD-10-CM | POA: Diagnosis not present

## 2021-12-07 DIAGNOSIS — E230 Hypopituitarism: Secondary | ICD-10-CM | POA: Diagnosis not present

## 2021-12-22 DIAGNOSIS — E230 Hypopituitarism: Secondary | ICD-10-CM | POA: Diagnosis not present

## 2022-01-11 DIAGNOSIS — M5441 Lumbago with sciatica, right side: Secondary | ICD-10-CM | POA: Diagnosis not present

## 2022-01-11 DIAGNOSIS — D352 Benign neoplasm of pituitary gland: Secondary | ICD-10-CM | POA: Diagnosis not present

## 2022-01-11 DIAGNOSIS — K219 Gastro-esophageal reflux disease without esophagitis: Secondary | ICD-10-CM | POA: Diagnosis not present

## 2022-01-11 DIAGNOSIS — E230 Hypopituitarism: Secondary | ICD-10-CM | POA: Diagnosis not present

## 2022-01-11 DIAGNOSIS — J449 Chronic obstructive pulmonary disease, unspecified: Secondary | ICD-10-CM | POA: Diagnosis not present

## 2022-02-09 DIAGNOSIS — G5711 Meralgia paresthetica, right lower limb: Secondary | ICD-10-CM | POA: Diagnosis not present

## 2022-02-09 DIAGNOSIS — M48062 Spinal stenosis, lumbar region with neurogenic claudication: Secondary | ICD-10-CM | POA: Diagnosis not present

## 2022-02-09 DIAGNOSIS — M25551 Pain in right hip: Secondary | ICD-10-CM | POA: Diagnosis not present

## 2022-02-09 DIAGNOSIS — D497 Neoplasm of unspecified behavior of endocrine glands and other parts of nervous system: Secondary | ICD-10-CM | POA: Diagnosis not present

## 2022-02-15 DIAGNOSIS — K219 Gastro-esophageal reflux disease without esophagitis: Secondary | ICD-10-CM | POA: Diagnosis not present

## 2022-02-15 DIAGNOSIS — G8929 Other chronic pain: Secondary | ICD-10-CM | POA: Diagnosis not present

## 2022-02-15 DIAGNOSIS — J449 Chronic obstructive pulmonary disease, unspecified: Secondary | ICD-10-CM | POA: Diagnosis not present

## 2022-02-15 DIAGNOSIS — E785 Hyperlipidemia, unspecified: Secondary | ICD-10-CM | POA: Diagnosis not present

## 2022-02-15 DIAGNOSIS — M47816 Spondylosis without myelopathy or radiculopathy, lumbar region: Secondary | ICD-10-CM | POA: Diagnosis not present

## 2022-02-21 DIAGNOSIS — E230 Hypopituitarism: Secondary | ICD-10-CM | POA: Diagnosis not present

## 2022-02-21 DIAGNOSIS — I700 Atherosclerosis of aorta: Secondary | ICD-10-CM | POA: Diagnosis not present

## 2022-02-21 DIAGNOSIS — J449 Chronic obstructive pulmonary disease, unspecified: Secondary | ICD-10-CM | POA: Diagnosis not present

## 2022-02-21 DIAGNOSIS — M5441 Lumbago with sciatica, right side: Secondary | ICD-10-CM | POA: Diagnosis not present

## 2022-03-16 DIAGNOSIS — M5136 Other intervertebral disc degeneration, lumbar region: Secondary | ICD-10-CM | POA: Diagnosis not present

## 2022-03-16 DIAGNOSIS — M5126 Other intervertebral disc displacement, lumbar region: Secondary | ICD-10-CM | POA: Diagnosis not present

## 2022-03-16 DIAGNOSIS — D497 Neoplasm of unspecified behavior of endocrine glands and other parts of nervous system: Secondary | ICD-10-CM | POA: Diagnosis not present

## 2022-03-16 DIAGNOSIS — M2578 Osteophyte, vertebrae: Secondary | ICD-10-CM | POA: Diagnosis not present

## 2022-03-16 DIAGNOSIS — M48062 Spinal stenosis, lumbar region with neurogenic claudication: Secondary | ICD-10-CM | POA: Diagnosis not present

## 2022-03-16 DIAGNOSIS — G9389 Other specified disorders of brain: Secondary | ICD-10-CM | POA: Diagnosis not present

## 2022-03-16 DIAGNOSIS — M4316 Spondylolisthesis, lumbar region: Secondary | ICD-10-CM | POA: Diagnosis not present

## 2022-03-16 DIAGNOSIS — D352 Benign neoplasm of pituitary gland: Secondary | ICD-10-CM | POA: Diagnosis not present

## 2022-03-27 DIAGNOSIS — M48062 Spinal stenosis, lumbar region with neurogenic claudication: Secondary | ICD-10-CM | POA: Diagnosis not present

## 2022-03-27 DIAGNOSIS — D497 Neoplasm of unspecified behavior of endocrine glands and other parts of nervous system: Secondary | ICD-10-CM | POA: Diagnosis not present

## 2022-04-25 DIAGNOSIS — M48062 Spinal stenosis, lumbar region with neurogenic claudication: Secondary | ICD-10-CM | POA: Diagnosis not present

## 2022-04-25 DIAGNOSIS — Z9889 Other specified postprocedural states: Secondary | ICD-10-CM | POA: Diagnosis not present

## 2022-06-27 ENCOUNTER — Ambulatory Visit
Admission: RE | Admit: 2022-06-27 | Discharge: 2022-06-27 | Disposition: A | Discharge status: Home / Self Care | Payer: 59 | Source: Ambulatory Visit | Attending: Internal Medicine | Admitting: Internal Medicine

## 2022-06-27 ENCOUNTER — Other Ambulatory Visit: Payer: Self-pay | Admitting: Internal Medicine

## 2022-06-27 DIAGNOSIS — R61 Generalized hyperhidrosis: Secondary | ICD-10-CM

## 2022-06-27 DIAGNOSIS — Z720 Tobacco use: Secondary | ICD-10-CM

## 2022-06-27 DIAGNOSIS — R058 Other specified cough: Secondary | ICD-10-CM

## 2022-07-07 ENCOUNTER — Other Ambulatory Visit: Payer: Self-pay | Admitting: Internal Medicine

## 2022-07-07 DIAGNOSIS — D352 Benign neoplasm of pituitary gland: Secondary | ICD-10-CM

## 2022-07-17 ENCOUNTER — Other Ambulatory Visit: Payer: Self-pay | Admitting: Internal Medicine

## 2022-07-17 DIAGNOSIS — E230 Hypopituitarism: Secondary | ICD-10-CM

## 2022-07-18 ENCOUNTER — Other Ambulatory Visit: Payer: Self-pay | Admitting: Internal Medicine

## 2022-08-02 ENCOUNTER — Other Ambulatory Visit: Payer: 59

## 2022-08-04 ENCOUNTER — Other Ambulatory Visit: Payer: Self-pay | Admitting: Internal Medicine

## 2022-08-04 DIAGNOSIS — E230 Hypopituitarism: Secondary | ICD-10-CM

## 2022-08-25 ENCOUNTER — Ambulatory Visit
Admission: RE | Admit: 2022-08-25 | Discharge: 2022-08-25 | Disposition: A | Discharge status: Home / Self Care | Payer: 59 | Source: Ambulatory Visit | Attending: Internal Medicine | Admitting: Internal Medicine

## 2022-08-25 ENCOUNTER — Other Ambulatory Visit: Payer: 59

## 2022-08-25 DIAGNOSIS — D352 Benign neoplasm of pituitary gland: Secondary | ICD-10-CM

## 2022-08-25 DIAGNOSIS — E230 Hypopituitarism: Secondary | ICD-10-CM

## 2022-08-25 MED ORDER — GADOPICLENOL 0.5 MMOL/ML IV SOLN
4.0000 mL | Freq: Once | INTRAVENOUS | Status: AC | PRN
  Administered 2022-08-25: 4 mL via INTRAVENOUS

## 2022-09-22 DIAGNOSIS — H524 Presbyopia: Secondary | ICD-10-CM | POA: Diagnosis not present

## 2022-09-29 DIAGNOSIS — L219 Seborrheic dermatitis, unspecified: Secondary | ICD-10-CM | POA: Diagnosis not present

## 2022-10-03 ENCOUNTER — Encounter: Payer: Self-pay | Admitting: Gastroenterology

## 2022-10-16 DIAGNOSIS — L219 Seborrheic dermatitis, unspecified: Secondary | ICD-10-CM | POA: Diagnosis not present

## 2022-11-08 DIAGNOSIS — L239 Allergic contact dermatitis, unspecified cause: Secondary | ICD-10-CM | POA: Diagnosis not present

## 2022-11-10 DIAGNOSIS — J449 Chronic obstructive pulmonary disease, unspecified: Secondary | ICD-10-CM | POA: Diagnosis not present

## 2022-11-28 DIAGNOSIS — Z86010 Personal history of colonic polyps: Secondary | ICD-10-CM | POA: Diagnosis not present

## 2022-11-28 DIAGNOSIS — K648 Other hemorrhoids: Secondary | ICD-10-CM | POA: Diagnosis not present

## 2022-11-28 DIAGNOSIS — Z09 Encounter for follow-up examination after completed treatment for conditions other than malignant neoplasm: Secondary | ICD-10-CM | POA: Diagnosis not present

## 2022-11-28 DIAGNOSIS — D123 Benign neoplasm of transverse colon: Secondary | ICD-10-CM | POA: Diagnosis not present

## 2022-11-30 DIAGNOSIS — L989 Disorder of the skin and subcutaneous tissue, unspecified: Secondary | ICD-10-CM | POA: Diagnosis not present

## 2022-11-30 DIAGNOSIS — D123 Benign neoplasm of transverse colon: Secondary | ICD-10-CM | POA: Diagnosis not present

## 2022-11-30 DIAGNOSIS — L309 Dermatitis, unspecified: Secondary | ICD-10-CM | POA: Diagnosis not present

## 2022-12-20 DIAGNOSIS — E230 Hypopituitarism: Secondary | ICD-10-CM | POA: Diagnosis not present

## 2022-12-28 DIAGNOSIS — E230 Hypopituitarism: Secondary | ICD-10-CM | POA: Diagnosis not present

## 2022-12-28 DIAGNOSIS — D352 Benign neoplasm of pituitary gland: Secondary | ICD-10-CM | POA: Diagnosis not present

## 2023-01-03 ENCOUNTER — Emergency Department (HOSPITAL_COMMUNITY)
Admission: EM | Admit: 2023-01-03 | Discharge: 2023-01-03 | Disposition: A | Discharge status: Home / Self Care | Payer: 59 | Attending: Emergency Medicine | Admitting: Emergency Medicine

## 2023-01-03 ENCOUNTER — Emergency Department (HOSPITAL_COMMUNITY): Payer: 59

## 2023-01-03 ENCOUNTER — Encounter (HOSPITAL_COMMUNITY): Payer: Self-pay

## 2023-01-03 ENCOUNTER — Other Ambulatory Visit: Payer: Self-pay

## 2023-01-03 DIAGNOSIS — Z79899 Other long term (current) drug therapy: Secondary | ICD-10-CM | POA: Insufficient documentation

## 2023-01-03 DIAGNOSIS — N200 Calculus of kidney: Secondary | ICD-10-CM | POA: Insufficient documentation

## 2023-01-03 DIAGNOSIS — Z7982 Long term (current) use of aspirin: Secondary | ICD-10-CM | POA: Insufficient documentation

## 2023-01-03 DIAGNOSIS — R109 Unspecified abdominal pain: Secondary | ICD-10-CM | POA: Diagnosis present

## 2023-01-03 LAB — COMPREHENSIVE METABOLIC PANEL
ALT: 16 U/L (ref 0–44)
AST: 25 U/L (ref 15–41)
Albumin: 4.4 g/dL (ref 3.5–5.0)
Alkaline Phosphatase: 53 U/L (ref 38–126)
Anion gap: 11 (ref 5–15)
BUN: 13 mg/dL (ref 8–23)
CO2: 23 mmol/L (ref 22–32)
Calcium: 9.5 mg/dL (ref 8.9–10.3)
Chloride: 107 mmol/L (ref 98–111)
Creatinine, Ser: 0.91 mg/dL (ref 0.61–1.24)
GFR, Estimated: >60 mL/min (ref >60)
Glucose, Bld: 114 mg/dL — ABNORMAL HIGH (ref 70–99)
Potassium: 3.5 mmol/L (ref 3.5–5.1)
Sodium: 141 mmol/L (ref 135–145)
Total Bilirubin: 0.6 mg/dL (ref 0.3–1.2)
Total Protein: 7.9 g/dL (ref 6.5–8.1)

## 2023-01-03 LAB — CBC WITH DIFFERENTIAL/PLATELET
Abs Immature Granulocytes: 0.02 10*3/uL (ref 0.00–0.07)
Basophils Absolute: 0 10*3/uL (ref 0.0–0.1)
Basophils Relative: 1 %
Eosinophils Absolute: 0.3 10*3/uL (ref 0.0–0.5)
Eosinophils Relative: 4 %
HCT: 43.4 % (ref 39.0–52.0)
Hemoglobin: 15.3 g/dL (ref 13.0–17.0)
Immature Granulocytes: 0 %
Lymphocytes Relative: 46 %
Lymphs Abs: 4 10*3/uL (ref 0.7–4.0)
MCH: 32.2 pg (ref 26.0–34.0)
MCHC: 35.3 g/dL (ref 30.0–36.0)
MCV: 91.4 fL (ref 80.0–100.0)
Monocytes Absolute: 0.7 10*3/uL (ref 0.1–1.0)
Monocytes Relative: 8 %
Neutro Abs: 3.5 10*3/uL (ref 1.7–7.7)
Neutrophils Relative %: 41 %
Platelets: 259 10*3/uL (ref 150–400)
RBC: 4.75 MIL/uL (ref 4.22–5.81)
RDW: 13 % (ref 11.5–15.5)
WBC: 8.6 10*3/uL (ref 4.0–10.5)
nRBC: 0 % (ref 0.0–0.2)

## 2023-01-03 LAB — URINALYSIS, W/ REFLEX TO CULTURE (INFECTION SUSPECTED)
Bacteria, UA: NONE SEEN
Bilirubin Urine: NEGATIVE
Glucose, UA: NEGATIVE mg/dL
Ketones, ur: NEGATIVE mg/dL
Leukocytes,Ua: NEGATIVE
Nitrite: NEGATIVE
Protein, ur: NEGATIVE mg/dL
Specific Gravity, Urine: 1.02 (ref 1.005–1.030)
pH: 6 (ref 5.0–8.0)

## 2023-01-03 MED ORDER — HYDROMORPHONE HCL 1 MG/ML IJ SOLN
1.0000 mg | Freq: Once | INTRAMUSCULAR | Status: AC
  Administered 2023-01-03: 1 mg via INTRAVENOUS
  Filled 2023-01-03: qty 1

## 2023-01-03 MED ORDER — SODIUM CHLORIDE 0.9 % IV BOLUS
500.0000 mL | Freq: Once | INTRAVENOUS | Status: AC
  Administered 2023-01-03: 500 mL via INTRAVENOUS

## 2023-01-03 MED ORDER — HYDROCODONE-ACETAMINOPHEN 5-325 MG PO TABS: 1.0000 | ORAL_TABLET | ORAL | 0 refills | Status: AC | PRN

## 2023-01-03 MED ORDER — ONDANSETRON HCL 4 MG/2ML IJ SOLN
4.0000 mg | Freq: Once | INTRAMUSCULAR | Status: AC
  Administered 2023-01-03: 4 mg via INTRAVENOUS
  Filled 2023-01-03: qty 2

## 2023-01-03 MED ORDER — TAMSULOSIN HCL 0.4 MG PO CAPS: 0.4000 mg | ORAL_CAPSULE | Freq: Every day | ORAL | 0 refills | Status: AC

## 2023-01-03 MED ORDER — LACTATED RINGERS IV BOLUS
1000.0000 mL | Freq: Once | INTRAVENOUS | Status: AC
  Administered 2023-01-03: 1000 mL via INTRAVENOUS

## 2023-01-03 MED ORDER — MORPHINE SULFATE (PF) 4 MG/ML IV SOLN
4.0000 mg | Freq: Once | INTRAVENOUS | Status: AC
  Administered 2023-01-03: 4 mg via INTRAVENOUS
  Filled 2023-01-03: qty 1

## 2023-01-03 MED ORDER — IBUPROFEN 400 MG PO TABS: 400.0000 mg | ORAL_TABLET | Freq: Three times a day (TID) | ORAL | 0 refills | Status: AC

## 2023-01-03 MED ORDER — KETOROLAC TROMETHAMINE 30 MG/ML IJ SOLN
15.0000 mg | Freq: Once | INTRAMUSCULAR | Status: AC
  Administered 2023-01-03: 15 mg via INTRAVENOUS
  Filled 2023-01-03: qty 1

## 2023-01-03 NOTE — ED Notes (Signed)
Pt refusing vitals at this time. Requesting different pain medication. MD notified.

## 2023-01-03 NOTE — Discharge Instructions (Addendum)
As discussed, you have been diagnosed with kidney stones.

## 2023-01-03 NOTE — ED Provider Notes (Signed)
[place] EMERGENCY DEPARTMENT AT [facility] Provider Note   CSN: [id number] Arrival date & time: 01/03/23  1536     History  Chief Complaint  Patient presents with   Flank Pain    Rodney Ayala is a 72 y.o. male.  HPI Patient presents with his brother who assists with history.  He presents due to acute onset right flank pain that occurred 2 hours prior to ED arrival.  Since that time he has been unable to achieve a position of comfort and has had multiple episodes of vomiting.  Pain is nonradiating, severe.  No history of renal dysfunction, nor stone.  He is on chronic steroids.     Home Medications Prior to Admission medications   Medication Sig Start Date End Date Taking? Authorizing Provider  HYDROcodone-acetaminophen (NORCO/VICODIN) 5-325 MG tablet Take 1 tablet by mouth every 4 (four) hours as needed for severe pain. 01/03/23  Yes Gerhard Munch, MD  ibuprofen (ADVIL) 400 MG tablet Take 1 tablet (400 mg total) by mouth 3 (three) times daily for 3 days. Take one tablet three times daily for three days 01/03/23 01/06/23 Yes Gerhard Munch, MD  tamsulosin (FLOMAX) 0.4 MG CAPS capsule Take 1 capsule (0.4 mg total) by mouth daily for 10 days. 01/03/23 01/13/23 Yes Gerhard Munch, MD  albuterol (PROVENTIL HFA;VENTOLIN HFA) 108 (90 BASE) MCG/ACT inhaler Inhale 2 puffs into the lungs every 6 (six) hours as needed for wheezing or shortness of breath.     [provider]  albuterol (VENTOLIN HFA) 108 (90 Base) MCG/ACT inhaler Inhale 2 puffs into the lungs every 6 (six) hours as needed for wheezing or shortness of breath. 07/01/19   Coral Ceo, NP  ANDRODERM 4 MG/24HR PT24 patch Apply 1 patch topically daily. Uses a 4 mg and 2 mg patch together to equal 6 mg daily 08/24/15   [provider]  aspirin EC 81 MG tablet Take 81 mg by mouth daily.    [provider]  atorvastatin (LIPITOR) 10 MG tablet Take 10 mg by mouth daily.  08/28/16   [provider]  budesonide-formoterol (SYMBICORT) 80-4.5 MCG/ACT inhaler Inhale 2 puffs into the lungs 2 (two) times daily. Patient not taking: Reported on 10/01/2019 07/01/19   Coral Ceo, NP  CIALIS 5 MG tablet Take 5 mg by mouth daily as needed for erectile dysfunction.  08/28/16   [provider]  HYDROcodone-acetaminophen (NORCO/VICODIN) 5-325 MG tablet Take 1-2 tablets by mouth every 4 (four) hours as needed (pain). 12/05/17   Park Liter, DPM  hydrocortisone (ANUSOL-HC) 2.5 % rectal cream APPLY TO AFFECTED AREA TWICE A DAY 04/15/19   [provider]  hydrocortisone (CORTEF) 10 MG tablet Take 5 mg by mouth daily.     [provider]  hydrOXYzine (ATARAX/VISTARIL) 10 MG tablet Take 1 tablet (10 mg total) by mouth 3 (three) times daily. 06/22/18   McDonald, Mia A, PA-C  levothyroxine (SYNTHROID, LEVOTHROID) 88 MCG tablet Take 88 mcg by mouth daily before breakfast.    [provider]  meloxicam (MOBIC) 15 MG tablet Take 15 mg by mouth daily.  Patient not taking: Reported on 08/29/2021 08/31/16   [provider]  methocarbamol (ROBAXIN) 500 MG tablet Take 1 tablet (500 mg total) by mouth 2 (two) times daily. 04/06/17   Audry Pili, PA-C  naproxen sodium (ANAPROX) 220 MG tablet Take 220 mg by mouth daily as needed (pain). Patient not taking: Reported on 08/29/2021    [provider]  nicotine polacrilex (NICORETTE) 2 MG gum Take 2 mg by mouth as needed for smoking cessation.    [provider]  omeprazole (PRILOSEC) 20 MG capsule Take 20 mg by mouth daily.    [provider]  ondansetron (ZOFRAN) 4 MG tablet Take 1 tablet (4 mg total) by mouth every 8 (eight) hours as needed for nausea or vomiting. 10/10/17   Park Liter, DPM  RESTASIS 0.05 % ophthalmic emulsion INSTILL 1 DROP INTO BOTH EYES TWICE A DAY 01/15/18   [provider]  Testosterone (ANDRODERM) 2 MG/24HR PT24 Place 2 mg onto the skin daily. Uses a 2  mg and 4 mg patch together daily to equal 6 mg    [provider]  vitamin B-12 (CYANOCOBALAMIN) 500 MCG tablet Take 500 mcg by mouth daily.    [provider]  Zoster Vaccine Adjuvanted [facility]) injection Shingrix (PF) 50 mcg/0.5 mL intramuscular suspension, kit  TO BE ADMINISTERED BY PHARMACIST FOR IMMUNIZATION    [provider]      Allergies    Patient has no known allergies.    Review of Systems   Review of Systems  All other systems reviewed and are negative.   Physical Exam Updated Vital Signs BP (!) 152/69   Pulse (!) 49   Temp (!) 97.4 F (36.3 C) (Oral)   Resp 18   Ht  (1.702 m)   Wt 70.3 kg   SpO2 97%   BMI 24.27 kg/m  Physical Exam Vitals and nursing note reviewed.  Constitutional:      General: He is not in acute distress.    Appearance: He is well-developed.     Comments: Uncomfortable appearing adult male awake and alert walking about the room trying to find a position of comfort.  HENT:     Head: Normocephalic and atraumatic.  Eyes:     Conjunctiva/sclera: Conjunctivae normal.  Pulmonary:     Effort: Pulmonary effort is normal. No respiratory distress.     Breath sounds: No stridor.  Abdominal:     General: There is no distension.  Skin:    General: Skin is warm and dry.  Neurological:     Mental Status: He is alert and oriented to person, place, and time.     ED Results / Procedures / Treatments   Labs (all labs ordered are listed, but only abnormal results are displayed) Labs Reviewed  COMPREHENSIVE METABOLIC PANEL - Abnormal; Notable for the following components:      Result Value   Glucose, Bld 114 (*)    All other components within normal limits  URINALYSIS, W/ REFLEX TO CULTURE (INFECTION SUSPECTED) - Abnormal; Notable for the following components:   Hgb urine dipstick SMALL (*)    All other components within normal limits  CBC WITH DIFFERENTIAL/PLATELET    EKG None  Radiology CT Renal Stone  Study  Result Date: 01/03/2023 CLINICAL DATA:  Abdominal pain and flank pain. EXAM: CT ABDOMEN AND PELVIS WITHOUT CONTRAST TECHNIQUE: Multidetector CT imaging of the abdomen and pelvis was performed following the standard protocol without IV contrast. RADIATION DOSE REDUCTION: This exam was performed according to the departmental dose-optimization program which includes automated exposure control, adjustment of the mA and/or kV according to patient size and/or use of iterative reconstruction technique. COMPARISON:  None Available. FINDINGS: Lower chest: Lung bases are clear. Hepatobiliary: No focal hepatic lesion.  Gallbladder normal. Pancreas: Pancreas is normal. No ductal dilatation. No pancreatic inflammation. Spleen: Normal spleen  Adrenals/urinary tract: Adrenal glands and kidneys are normal. Nonobstructing calculus upper pole LEFT kidney. The ureters and bladder normal. Stomach/Bowel: Stomach, small bowel, appendix, and cecum are normal. The colon and rectosigmoid colon are normal. Vascular/Lymphatic: Abdominal aorta is normal caliber with atherosclerotic calcification. There is no retroperitoneal or periportal lymphadenopathy. No pelvic lymphadenopathy. Reproductive: Prostate unremarkable Other: No free fluid. Musculoskeletal: No aggressive osseous lesion. Degenerative endplate sclerosis and osteophytosis most severe L3-L4. IMPRESSION: 1. No acute findings in the abdomen pelvis. 2. Nonobstructing LEFT renal calculus. 3.  Aortic Atherosclerosis (ICD10-I70.0). Electronically Signed   By: Genevive Bi M.D.   On: 01/03/2023 18:33    Procedures Procedures    Medications Ordered in ED Medications  morphine (PF) 4 MG/ML injection 4 mg (4 mg Intravenous Given 01/03/23 1623)  ketorolac (TORADOL) 30 MG/ML injection 15 mg (15 mg Intravenous Given 01/03/23 1623)  ondansetron (ZOFRAN) injection 4 mg (4 mg Intravenous Given 01/03/23 1623)  sodium chloride 0.9 % bolus 500 mL (0 mLs Intravenous Stopped 01/03/23  1809)  morphine (PF) 4 MG/ML injection 4 mg (4 mg Intravenous Given 01/03/23 1743)  HYDROmorphone (DILAUDID) injection 1 mg (1 mg Intravenous Given 01/03/23 1817)  ondansetron (ZOFRAN) injection 4 mg (4 mg Intravenous Given 01/03/23 1941)  lactated ringers bolus 1,000 mL (1,000 mLs Intravenous New Bag/Given 01/03/23 1946)    ED Course/ Medical Decision Making/ A&P                             Medical Decision Making Adult male with history of COPD, otherwise previously well presents with acute onset flank pain.  Differential includes infection, versus kidney stone versus other intra-abdominal processes.  Less likely pneumonia.   Amount and/or Complexity of Data Reviewed Independent Historian:     Details: Brother at bedside Labs: ordered. Decision-making details documented in ED Course. Radiology: ordered and independent interpretation performed. Decision-making details documented in ED Course.  Risk Prescription drug management. Decision regarding hospitalization.   8:41 PM Patient awake, alert, sitting on the edge of the bed stating that he is ready to go. I reviewed his CT, and reevaluated the patient several times.  Pain is now under control, no evidence for obstruction, or infection, bacteremia or sepsis.  There is evidence for kidney stones likely explaining the patient's presentation and pain. Patient will follow-up with urology, be discharged in stable condition.  Daughter present aware of all instructions.        Final Clinical Impression(s) / ED Diagnoses Final diagnoses:  Nephrolithiasis    Rx / DC Orders ED Discharge Orders          Ordered    HYDROcodone-acetaminophen (NORCO/VICODIN) 5-325 MG tablet  Every 4 hours PRN        01/03/23 2041    tamsulosin (FLOMAX) 0.4 MG CAPS capsule  Daily        01/03/23 2041    ibuprofen (ADVIL) 400 MG tablet  3 times daily        01/03/23 2041              Gerhard Munch, MD 01/03/23 2041

## 2023-01-03 NOTE — ED Notes (Signed)
Pt returned from CT °

## 2023-01-03 NOTE — ED Triage Notes (Addendum)
Patient is here for evaluation of right sided back pain that radiates to the right flank. Patient reports pain started about 45 minutes ago. Pt unable to sit still in triage. Currently vomiting in triage as well.

## 2023-01-03 NOTE — ED Notes (Signed)
Pt requesting more medication. Unable to stay still for CT. MD notified. New order received.

## 2023-01-03 NOTE — ED Notes (Signed)
Patient verbalizes understanding of discharge instructions. Opportunity for questioning and answers were provided. Armband removed by staff, pt discharged from ED. Wheeled out to lobby with daughter  

## 2023-01-10 DIAGNOSIS — N200 Calculus of kidney: Secondary | ICD-10-CM | POA: Diagnosis not present

## 2023-03-29 DIAGNOSIS — I6782 Cerebral ischemia: Secondary | ICD-10-CM | POA: Diagnosis not present

## 2023-03-29 DIAGNOSIS — D497 Neoplasm of unspecified behavior of endocrine glands and other parts of nervous system: Secondary | ICD-10-CM | POA: Diagnosis not present

## 2023-03-29 DIAGNOSIS — D352 Benign neoplasm of pituitary gland: Secondary | ICD-10-CM | POA: Diagnosis not present

## 2023-06-28 DIAGNOSIS — D352 Benign neoplasm of pituitary gland: Secondary | ICD-10-CM | POA: Diagnosis not present

## 2023-06-28 DIAGNOSIS — E785 Hyperlipidemia, unspecified: Secondary | ICD-10-CM | POA: Diagnosis not present

## 2023-06-28 DIAGNOSIS — Z23 Encounter for immunization: Secondary | ICD-10-CM | POA: Diagnosis not present

## 2023-06-28 DIAGNOSIS — E230 Hypopituitarism: Secondary | ICD-10-CM | POA: Diagnosis not present

## 2023-07-26 DIAGNOSIS — Z0000 Encounter for general adult medical examination without abnormal findings: Secondary | ICD-10-CM | POA: Diagnosis not present

## 2023-07-26 DIAGNOSIS — K219 Gastro-esophageal reflux disease without esophagitis: Secondary | ICD-10-CM | POA: Diagnosis not present

## 2023-07-26 DIAGNOSIS — M47816 Spondylosis without myelopathy or radiculopathy, lumbar region: Secondary | ICD-10-CM | POA: Diagnosis not present

## 2023-07-26 DIAGNOSIS — M5441 Lumbago with sciatica, right side: Secondary | ICD-10-CM | POA: Diagnosis not present

## 2023-07-26 DIAGNOSIS — F17200 Nicotine dependence, unspecified, uncomplicated: Secondary | ICD-10-CM | POA: Diagnosis not present

## 2023-07-26 DIAGNOSIS — E230 Hypopituitarism: Secondary | ICD-10-CM | POA: Diagnosis not present

## 2023-07-26 DIAGNOSIS — Z23 Encounter for immunization: Secondary | ICD-10-CM | POA: Diagnosis not present

## 2023-07-26 DIAGNOSIS — E785 Hyperlipidemia, unspecified: Secondary | ICD-10-CM | POA: Diagnosis not present

## 2023-07-26 DIAGNOSIS — I700 Atherosclerosis of aorta: Secondary | ICD-10-CM | POA: Diagnosis not present

## 2023-08-29 DIAGNOSIS — H02834 Dermatochalasis of left upper eyelid: Secondary | ICD-10-CM | POA: Diagnosis not present

## 2023-08-29 DIAGNOSIS — H35033 Hypertensive retinopathy, bilateral: Secondary | ICD-10-CM | POA: Diagnosis not present

## 2023-08-29 DIAGNOSIS — H40023 Open angle with borderline findings, high risk, bilateral: Secondary | ICD-10-CM | POA: Diagnosis not present

## 2023-08-29 DIAGNOSIS — H02831 Dermatochalasis of right upper eyelid: Secondary | ICD-10-CM | POA: Diagnosis not present

## 2023-08-29 DIAGNOSIS — H2513 Age-related nuclear cataract, bilateral: Secondary | ICD-10-CM | POA: Diagnosis not present

## 2023-10-09 DIAGNOSIS — D352 Benign neoplasm of pituitary gland: Secondary | ICD-10-CM | POA: Diagnosis not present

## 2023-10-09 DIAGNOSIS — M76899 Other specified enthesopathies of unspecified lower limb, excluding foot: Secondary | ICD-10-CM | POA: Diagnosis not present

## 2023-10-09 DIAGNOSIS — J449 Chronic obstructive pulmonary disease, unspecified: Secondary | ICD-10-CM | POA: Diagnosis not present

## 2024-01-30 DIAGNOSIS — N200 Calculus of kidney: Secondary | ICD-10-CM | POA: Diagnosis not present

## 2024-02-01 DIAGNOSIS — F5101 Primary insomnia: Secondary | ICD-10-CM | POA: Diagnosis not present

## 2024-02-01 DIAGNOSIS — F17200 Nicotine dependence, unspecified, uncomplicated: Secondary | ICD-10-CM | POA: Diagnosis not present

## 2024-02-01 DIAGNOSIS — E230 Hypopituitarism: Secondary | ICD-10-CM | POA: Diagnosis not present

## 2024-02-01 DIAGNOSIS — J449 Chronic obstructive pulmonary disease, unspecified: Secondary | ICD-10-CM | POA: Diagnosis not present

## 2024-04-02 DIAGNOSIS — E230 Hypopituitarism: Secondary | ICD-10-CM | POA: Diagnosis not present

## 2024-04-02 DIAGNOSIS — I700 Atherosclerosis of aorta: Secondary | ICD-10-CM | POA: Diagnosis not present

## 2024-04-02 DIAGNOSIS — F17200 Nicotine dependence, unspecified, uncomplicated: Secondary | ICD-10-CM | POA: Diagnosis not present

## 2024-04-02 DIAGNOSIS — L219 Seborrheic dermatitis, unspecified: Secondary | ICD-10-CM | POA: Diagnosis not present

## 2024-08-16 ENCOUNTER — Encounter: Payer: Self-pay | Admitting: Gastroenterology
